# Patient Record
Sex: Male | Born: 1960 | Race: Black or African American | Hispanic: No | Marital: Single | Smoking: Current some day smoker
Health system: Southern US, Community
[De-identification: ages and names within clinical notes are randomized; demographics above are authoritative.]

## PROBLEM LIST (undated history)

## (undated) DIAGNOSIS — I10 Essential (primary) hypertension: Secondary | ICD-10-CM

## (undated) HISTORY — PX: EYE SURGERY: SHX253

---

## 2004-07-13 ENCOUNTER — Encounter: Admission: RE | Admit: 2004-07-13 | Discharge: 2004-07-13 | Payer: Self-pay | Admitting: Internal Medicine

## 2007-09-03 ENCOUNTER — Emergency Department (HOSPITAL_COMMUNITY): Admission: EM | Admit: 2007-09-03 | Discharge: 2007-09-04 | Payer: Self-pay | Admitting: Emergency Medicine

## 2010-07-01 ENCOUNTER — Emergency Department (HOSPITAL_COMMUNITY): Admission: EM | Admit: 2010-07-01 | Discharge: 2010-07-01 | Payer: Self-pay | Admitting: Family Medicine

## 2010-07-04 ENCOUNTER — Emergency Department (HOSPITAL_COMMUNITY): Admission: EM | Admit: 2010-07-04 | Discharge: 2010-07-04 | Payer: Self-pay | Admitting: Family Medicine

## 2010-07-07 ENCOUNTER — Emergency Department (HOSPITAL_COMMUNITY): Admission: EM | Admit: 2010-07-07 | Discharge: 2010-07-07 | Payer: Self-pay | Admitting: Family Medicine

## 2010-07-09 ENCOUNTER — Inpatient Hospital Stay (HOSPITAL_COMMUNITY): Admission: EM | Admit: 2010-07-09 | Discharge: 2010-07-11 | Payer: Self-pay | Admitting: Emergency Medicine

## 2010-07-09 ENCOUNTER — Ambulatory Visit: Payer: Self-pay | Admitting: Cardiology

## 2010-07-10 ENCOUNTER — Encounter (INDEPENDENT_AMBULATORY_CARE_PROVIDER_SITE_OTHER): Payer: Self-pay | Admitting: Internal Medicine

## 2010-08-10 ENCOUNTER — Emergency Department (HOSPITAL_COMMUNITY)
Admission: EM | Admit: 2010-08-10 | Discharge: 2010-08-10 | Disposition: A | Discharge status: Other Acute Inpatient Hospital | Payer: Self-pay | Source: Home / Self Care | Admitting: Family Medicine

## 2010-08-10 ENCOUNTER — Emergency Department (HOSPITAL_COMMUNITY)
Admission: EM | Admit: 2010-08-10 | Discharge: 2010-08-10 | Payer: Self-pay | Source: Home / Self Care | Admitting: Emergency Medicine

## 2010-08-12 ENCOUNTER — Inpatient Hospital Stay (HOSPITAL_COMMUNITY)
Admission: EM | Admit: 2010-08-12 | Discharge: 2010-08-15 | Payer: Self-pay | Source: Home / Self Care | Attending: Internal Medicine | Admitting: Internal Medicine

## 2010-08-12 LAB — URINALYSIS, ROUTINE W REFLEX MICROSCOPIC
Bilirubin Urine: NEGATIVE
Hemoglobin, Urine: NEGATIVE
Ketones, ur: NEGATIVE mg/dL
Nitrite: NEGATIVE
Protein, ur: NEGATIVE mg/dL
Specific Gravity, Urine: >1.046 — ABNORMAL HIGH (ref 1.005–1.030)
Urine Glucose, Fasting: NEGATIVE mg/dL
Urobilinogen, UA: 0.2 mg/dL (ref 0.0–1.0)
pH: 6.5 (ref 5.0–8.0)

## 2010-08-12 LAB — DIFFERENTIAL
Basophils Absolute: 0 10*3/uL (ref 0.0–0.1)
Basophils Relative: 0 % (ref 0–1)
Eosinophils Absolute: 0.3 10*3/uL (ref 0.0–0.7)
Eosinophils Relative: 4 % (ref 0–5)
Lymphocytes Relative: 40 % (ref 12–46)
Lymphs Abs: 2.9 10*3/uL (ref 0.7–4.0)
Monocytes Absolute: 0.7 10*3/uL (ref 0.1–1.0)
Monocytes Relative: 9 % (ref 3–12)
Neutro Abs: 3.4 10*3/uL (ref 1.7–7.7)
Neutrophils Relative %: 46 % (ref 43–77)

## 2010-08-12 LAB — VANCOMYCIN, TROUGH: Vancomycin Tr: 5.3 ug/mL — ABNORMAL LOW (ref 10.0–20.0)

## 2010-08-12 LAB — BASIC METABOLIC PANEL
BUN: 9 mg/dL (ref 6–23)
CO2: 29 mEq/L (ref 19–32)
Calcium: 8.9 mg/dL (ref 8.4–10.5)
Chloride: 103 mEq/L (ref 96–112)
Creatinine, Ser: 0.95 mg/dL (ref 0.4–1.5)
GFR calc Af Amer: >60 mL/min (ref >60)
GFR calc non Af Amer: >60 mL/min (ref >60)
Glucose, Bld: 109 mg/dL — ABNORMAL HIGH (ref 70–99)
Potassium: 3.6 mEq/L (ref 3.5–5.1)
Sodium: 139 mEq/L (ref 135–145)

## 2010-08-12 LAB — CBC
HCT: 37.7 % — ABNORMAL LOW (ref 39.0–52.0)
Hemoglobin: 12.7 g/dL — ABNORMAL LOW (ref 13.0–17.0)
MCH: 28.7 pg (ref 26.0–34.0)
MCHC: 33.7 g/dL (ref 30.0–36.0)
MCV: 85.1 fL (ref 78.0–100.0)
Platelets: 215 10*3/uL (ref 150–400)
RBC: 4.43 MIL/uL (ref 4.22–5.81)
RDW: 13 % (ref 11.5–15.5)
WBC: 7.3 10*3/uL (ref 4.0–10.5)

## 2010-08-12 LAB — MRSA PCR SCREENING: MRSA by PCR: NEGATIVE

## 2010-08-13 LAB — BASIC METABOLIC PANEL
BUN: 6 mg/dL (ref 6–23)
CO2: 28 mEq/L (ref 19–32)
Calcium: 8.8 mg/dL (ref 8.4–10.5)
Chloride: 106 mEq/L (ref 96–112)
Creatinine, Ser: 1.04 mg/dL (ref 0.4–1.5)
GFR calc Af Amer: >60 mL/min (ref >60)
GFR calc non Af Amer: >60 mL/min (ref >60)
Glucose, Bld: 91 mg/dL (ref 70–99)
Potassium: 3.9 mEq/L (ref 3.5–5.1)
Sodium: 138 mEq/L (ref 135–145)

## 2010-08-13 LAB — CBC
HCT: 39.2 % (ref 39.0–52.0)
Hemoglobin: 13 g/dL (ref 13.0–17.0)
MCH: 28.1 pg (ref 26.0–34.0)
MCHC: 33.2 g/dL (ref 30.0–36.0)
MCV: 84.8 fL (ref 78.0–100.0)
Platelets: 218 10*3/uL (ref 150–400)
RBC: 4.62 MIL/uL (ref 4.22–5.81)
RDW: 12.8 % (ref 11.5–15.5)
WBC: 7.2 10*3/uL (ref 4.0–10.5)

## 2010-08-13 LAB — SEDIMENTATION RATE: Sed Rate: 26 mm/hr — ABNORMAL HIGH (ref 0–16)

## 2010-08-14 LAB — EYE CULTURE: Culture: NO GROWTH

## 2010-09-11 ENCOUNTER — Observation Stay (HOSPITAL_COMMUNITY)
Admission: EM | Admit: 2010-09-11 | Discharge: 2010-09-12 | Disposition: A | Discharge status: Home / Self Care | Payer: Self-pay | Attending: Emergency Medicine | Admitting: Emergency Medicine

## 2010-09-11 ENCOUNTER — Emergency Department (HOSPITAL_COMMUNITY): Payer: Self-pay

## 2010-09-11 ENCOUNTER — Inpatient Hospital Stay (INDEPENDENT_AMBULATORY_CARE_PROVIDER_SITE_OTHER)
Admission: RE | Admit: 2010-09-11 | Discharge: 2010-09-11 | Disposition: A | Discharge status: Home / Self Care | Payer: Self-pay | Source: Ambulatory Visit | Attending: Family Medicine | Admitting: Family Medicine

## 2010-09-11 DIAGNOSIS — L02419 Cutaneous abscess of limb, unspecified: Principal | ICD-10-CM | POA: Insufficient documentation

## 2010-09-11 DIAGNOSIS — M7989 Other specified soft tissue disorders: Secondary | ICD-10-CM | POA: Insufficient documentation

## 2010-09-11 DIAGNOSIS — L03119 Cellulitis of unspecified part of limb: Secondary | ICD-10-CM | POA: Insufficient documentation

## 2010-09-11 DIAGNOSIS — I10 Essential (primary) hypertension: Secondary | ICD-10-CM

## 2010-09-11 LAB — DIFFERENTIAL
Basophils Absolute: 0 10*3/uL (ref 0.0–0.1)
Basophils Relative: 0 % (ref 0–1)
Eosinophils Absolute: 0.3 10*3/uL (ref 0.0–0.7)
Eosinophils Relative: 2 % (ref 0–5)
Lymphocytes Relative: 15 % (ref 12–46)
Lymphs Abs: 1.6 10*3/uL (ref 0.7–4.0)
Monocytes Absolute: 0.8 10*3/uL (ref 0.1–1.0)
Monocytes Relative: 7 % (ref 3–12)
Neutro Abs: 8.3 10*3/uL — ABNORMAL HIGH (ref 1.7–7.7)
Neutrophils Relative %: 76 % (ref 43–77)

## 2010-09-11 LAB — CBC
HCT: 40.1 % (ref 39.0–52.0)
Hemoglobin: 13.5 g/dL (ref 13.0–17.0)
MCH: 28.5 pg (ref 26.0–34.0)
MCHC: 33.7 g/dL (ref 30.0–36.0)
MCV: 84.6 fL (ref 78.0–100.0)
Platelets: 250 10*3/uL (ref 150–400)
RBC: 4.74 MIL/uL (ref 4.22–5.81)
RDW: 13.6 % (ref 11.5–15.5)
WBC: 10.9 10*3/uL — ABNORMAL HIGH (ref 4.0–10.5)

## 2010-09-11 LAB — POCT I-STAT, CHEM 8
BUN: 16 mg/dL (ref 6–23)
Calcium, Ion: 1.18 mmol/L (ref 1.12–1.32)
Chloride: 105 mEq/L (ref 96–112)
Creatinine, Ser: 1.2 mg/dL (ref 0.4–1.5)
Glucose, Bld: 97 mg/dL (ref 70–99)
HCT: 42 % (ref 39.0–52.0)
Hemoglobin: 14.3 g/dL (ref 13.0–17.0)
Potassium: 3.9 mEq/L (ref 3.5–5.1)
Sodium: 141 mEq/L (ref 135–145)
TCO2: 27 mmol/L (ref 0–100)

## 2010-09-12 DIAGNOSIS — M7989 Other specified soft tissue disorders: Secondary | ICD-10-CM

## 2010-09-12 LAB — HEPATIC FUNCTION PANEL
ALT: 29 U/L (ref 0–53)
AST: 25 U/L (ref 0–37)
Albumin: 4.3 g/dL (ref 3.5–5.2)
Alkaline Phosphatase: 47 U/L (ref 39–117)
Bilirubin, Direct: 0.1 mg/dL (ref 0.0–0.3)
Indirect Bilirubin: 0.5 mg/dL (ref 0.3–0.9)
Total Bilirubin: 0.6 mg/dL (ref 0.3–1.2)
Total Protein: 7.2 g/dL (ref 6.0–8.3)

## 2010-09-12 LAB — PROTIME-INR
INR: 1.11 (ref 0.00–1.49)
Prothrombin Time: 14.5 seconds (ref 11.6–15.2)

## 2010-09-12 LAB — URINALYSIS, ROUTINE W REFLEX MICROSCOPIC
Bilirubin Urine: NEGATIVE
Ketones, ur: NEGATIVE mg/dL
Leukocytes, UA: NEGATIVE
Nitrite: NEGATIVE
Protein, ur: NEGATIVE mg/dL
Specific Gravity, Urine: 1.011 (ref 1.005–1.030)
Urine Glucose, Fasting: NEGATIVE mg/dL
Urobilinogen, UA: 0.2 mg/dL (ref 0.0–1.0)
pH: 5.5 (ref 5.0–8.0)

## 2010-09-12 LAB — URINE MICROSCOPIC-ADD ON

## 2010-09-12 LAB — APTT: aPTT: 30 seconds (ref 24–37)

## 2010-09-19 ENCOUNTER — Emergency Department (HOSPITAL_COMMUNITY): Payer: Self-pay

## 2010-09-19 ENCOUNTER — Observation Stay (HOSPITAL_COMMUNITY)
Admission: EM | Admit: 2010-09-19 | Discharge: 2010-09-20 | Disposition: A | Discharge status: Home / Self Care | Payer: Self-pay | Attending: Emergency Medicine | Admitting: Emergency Medicine

## 2010-09-19 DIAGNOSIS — L03119 Cellulitis of unspecified part of limb: Secondary | ICD-10-CM | POA: Insufficient documentation

## 2010-09-19 DIAGNOSIS — L02419 Cutaneous abscess of limb, unspecified: Principal | ICD-10-CM | POA: Insufficient documentation

## 2010-09-19 DIAGNOSIS — I10 Essential (primary) hypertension: Secondary | ICD-10-CM | POA: Insufficient documentation

## 2010-09-19 DIAGNOSIS — M7989 Other specified soft tissue disorders: Secondary | ICD-10-CM | POA: Insufficient documentation

## 2010-09-19 LAB — COMPREHENSIVE METABOLIC PANEL
AST: 31 U/L (ref 0–37)
Albumin: 4.2 g/dL (ref 3.5–5.2)
Alkaline Phosphatase: 46 U/L (ref 39–117)
BUN: 15 mg/dL (ref 6–23)
CO2: 23 mEq/L (ref 19–32)
Chloride: 101 mEq/L (ref 96–112)
GFR calc Af Amer: >60 mL/min (ref >60)
GFR calc non Af Amer: >60 mL/min (ref >60)
Potassium: 3.6 mEq/L (ref 3.5–5.1)
Total Bilirubin: 0.2 mg/dL — ABNORMAL LOW (ref 0.3–1.2)

## 2010-09-19 LAB — DIFFERENTIAL
Basophils Absolute: 0 10*3/uL (ref 0.0–0.1)
Basophils Relative: 0 % (ref 0–1)
Eosinophils Absolute: 0.3 10*3/uL (ref 0.0–0.7)
Eosinophils Relative: 4 % (ref 0–5)
Lymphocytes Relative: 44 % (ref 12–46)
Lymphs Abs: 3.7 10*3/uL (ref 0.7–4.0)
Monocytes Absolute: 0.5 10*3/uL (ref 0.1–1.0)
Monocytes Relative: 6 % (ref 3–12)
Neutro Abs: 4 10*3/uL (ref 1.7–7.7)
Neutrophils Relative %: 47 % (ref 43–77)

## 2010-09-19 LAB — CBC
HCT: 39.5 % (ref 39.0–52.0)
Hemoglobin: 13.6 g/dL (ref 13.0–17.0)
MCH: 28.8 pg (ref 26.0–34.0)
MCHC: 34.4 g/dL (ref 30.0–36.0)
MCV: 83.5 fL (ref 78.0–100.0)
Platelets: 288 10*3/uL (ref 150–400)
RBC: 4.73 MIL/uL (ref 4.22–5.81)
RDW: 13.2 % (ref 11.5–15.5)
WBC: 8.6 10*3/uL (ref 4.0–10.5)

## 2010-09-26 LAB — CULTURE, BLOOD (ROUTINE X 2)
Culture  Setup Time: 201202120114
Culture: NO GROWTH

## 2010-10-04 ENCOUNTER — Emergency Department (HOSPITAL_COMMUNITY)
Admission: EM | Admit: 2010-10-04 | Discharge: 2010-10-04 | Disposition: A | Discharge status: Home / Self Care | Payer: Self-pay | Attending: Emergency Medicine | Admitting: Emergency Medicine

## 2010-10-04 ENCOUNTER — Emergency Department (HOSPITAL_COMMUNITY): Payer: Self-pay

## 2010-10-04 DIAGNOSIS — M25469 Effusion, unspecified knee: Secondary | ICD-10-CM | POA: Insufficient documentation

## 2010-10-04 DIAGNOSIS — I10 Essential (primary) hypertension: Secondary | ICD-10-CM | POA: Insufficient documentation

## 2010-10-04 DIAGNOSIS — M7989 Other specified soft tissue disorders: Secondary | ICD-10-CM | POA: Insufficient documentation

## 2010-10-20 LAB — URINALYSIS, ROUTINE W REFLEX MICROSCOPIC
Bilirubin Urine: NEGATIVE
Hgb urine dipstick: NEGATIVE
Ketones, ur: NEGATIVE mg/dL
Nitrite: NEGATIVE
Protein, ur: NEGATIVE mg/dL
Urobilinogen, UA: 0.2 mg/dL (ref 0.0–1.0)

## 2010-10-20 LAB — CBC
HCT: 40 % (ref 39.0–52.0)
Hemoglobin: 13.8 g/dL (ref 13.0–17.0)
Hemoglobin: 15.3 g/dL (ref 13.0–17.0)
MCH: 29.8 pg (ref 26.0–34.0)
MCHC: 35.3 g/dL (ref 30.0–36.0)
MCV: 85.9 fL (ref 78.0–100.0)
Platelets: 211 10*3/uL (ref 150–400)
RBC: 5.09 MIL/uL (ref 4.22–5.81)
RDW: 12.6 % (ref 11.5–15.5)
RDW: 12.8 % (ref 11.5–15.5)
WBC: 11.8 10*3/uL — ABNORMAL HIGH (ref 4.0–10.5)
WBC: 6.1 10*3/uL (ref 4.0–10.5)

## 2010-10-20 LAB — BASIC METABOLIC PANEL
BUN: 12 mg/dL (ref 6–23)
Calcium: 8.9 mg/dL (ref 8.4–10.5)
Calcium: 9.4 mg/dL (ref 8.4–10.5)
Creatinine, Ser: 1.03 mg/dL (ref 0.4–1.5)
GFR calc Af Amer: >60 mL/min (ref >60)
GFR calc Af Amer: >60 mL/min (ref >60)
GFR calc non Af Amer: >60 mL/min (ref >60)
GFR calc non Af Amer: >60 mL/min (ref >60)
Glucose, Bld: 97 mg/dL (ref 70–99)
Potassium: 4.5 mEq/L (ref 3.5–5.1)
Sodium: 140 mEq/L (ref 135–145)
Sodium: 140 mEq/L (ref 135–145)

## 2010-10-20 LAB — RAPID URINE DRUG SCREEN, HOSP PERFORMED
Barbiturates: NOT DETECTED
Tetrahydrocannabinol: NOT DETECTED

## 2010-10-20 LAB — DIFFERENTIAL
Basophils Absolute: 0 10*3/uL (ref 0.0–0.1)
Basophils Absolute: 0 10*3/uL (ref 0.0–0.1)
Basophils Relative: 0 % (ref 0–1)
Lymphocytes Relative: 29 % (ref 12–46)
Lymphocytes Relative: 48 % — ABNORMAL HIGH (ref 12–46)
Monocytes Absolute: 0.4 10*3/uL (ref 0.1–1.0)
Monocytes Relative: 6 % (ref 3–12)
Neutro Abs: 2.6 10*3/uL (ref 1.7–7.7)
Neutro Abs: 7.5 10*3/uL (ref 1.7–7.7)
Neutrophils Relative %: 42 % — ABNORMAL LOW (ref 43–77)
Neutrophils Relative %: 64 % (ref 43–77)

## 2010-10-20 LAB — ETHANOL: Alcohol, Ethyl (B): <5 mg/dL (ref 0–10)

## 2010-10-20 LAB — CARDIAC PANEL(CRET KIN+CKTOT+MB+TROPI)
Total CK: 290 U/L — ABNORMAL HIGH (ref 7–232)
Troponin I: 0.02 ng/mL (ref 0.00–0.06)

## 2010-10-20 LAB — LIPID PANEL
Total CHOL/HDL Ratio: 7.6 RATIO
VLDL: 50 mg/dL — ABNORMAL HIGH (ref 0–40)

## 2010-10-20 LAB — CK TOTAL AND CKMB (NOT AT ARMC)
CK, MB: 5 ng/mL — ABNORMAL HIGH (ref 0.3–4.0)
Relative Index: 1.1 (ref 0.0–2.5)
Total CK: 473 U/L — ABNORMAL HIGH (ref 7–232)

## 2010-10-20 LAB — HEPATIC FUNCTION PANEL
AST: 31 U/L (ref 0–37)
Albumin: 4 g/dL (ref 3.5–5.2)
Total Bilirubin: 0.4 mg/dL (ref 0.3–1.2)

## 2010-10-20 LAB — TROPONIN I: Troponin I: 0.02 ng/mL (ref 0.00–0.06)

## 2011-02-23 ENCOUNTER — Emergency Department (HOSPITAL_COMMUNITY)
Admission: EM | Admit: 2011-02-23 | Discharge: 2011-02-23 | Disposition: A | Discharge status: Home / Self Care | Payer: Self-pay | Attending: Emergency Medicine | Admitting: Emergency Medicine

## 2011-02-23 DIAGNOSIS — F17200 Nicotine dependence, unspecified, uncomplicated: Secondary | ICD-10-CM | POA: Insufficient documentation

## 2011-02-23 DIAGNOSIS — R52 Pain, unspecified: Secondary | ICD-10-CM | POA: Insufficient documentation

## 2011-02-23 DIAGNOSIS — I10 Essential (primary) hypertension: Secondary | ICD-10-CM | POA: Insufficient documentation

## 2011-08-28 ENCOUNTER — Emergency Department (INDEPENDENT_AMBULATORY_CARE_PROVIDER_SITE_OTHER)
Admission: EM | Admit: 2011-08-28 | Discharge: 2011-08-28 | Disposition: A | Discharge status: Home / Self Care | Payer: Self-pay | Source: Home / Self Care

## 2011-08-28 ENCOUNTER — Encounter (HOSPITAL_COMMUNITY): Payer: Self-pay | Admitting: *Deleted

## 2011-08-28 DIAGNOSIS — R197 Diarrhea, unspecified: Secondary | ICD-10-CM

## 2011-08-28 DIAGNOSIS — R3129 Other microscopic hematuria: Secondary | ICD-10-CM

## 2011-08-28 HISTORY — DX: Essential (primary) hypertension: I10

## 2011-08-28 LAB — POCT URINALYSIS DIP (DEVICE)
Glucose, UA: NEGATIVE mg/dL
Nitrite: NEGATIVE
Protein, ur: 30 mg/dL — AB
Urobilinogen, UA: 0.2 mg/dL (ref 0.0–1.0)

## 2011-08-28 LAB — STOOL CULTURE: Special Requests: NORMAL

## 2011-08-28 MED ORDER — ACETAMINOPHEN 325 MG PO TABS
ORAL_TABLET | ORAL | Status: AC
  Filled 2011-08-28: qty 3

## 2011-08-28 MED ORDER — CIPROFLOXACIN HCL 500 MG PO TABS: 500.0000 mg | ORAL_TABLET | Freq: Two times a day (BID) | ORAL | Status: AC

## 2011-08-28 MED ORDER — ACETAMINOPHEN 325 MG PO TABS
975.0000 mg | ORAL_TABLET | Freq: Once | ORAL | Status: AC
  Administered 2011-08-28: 975 mg via ORAL

## 2011-08-28 NOTE — ED Provider Notes (Signed)
Medical screening examination/treatment/procedure(s) were performed by non-physician practitioner and as supervising physician I was immediately available for consultation/collaboration.  LANEY,RONNIE   Ronnie Laney, MD 08/28/11 2137 

## 2011-08-28 NOTE — ED Notes (Addendum)
C/O intermittent periumbilical cramping since Thursday w/ nausea and watery diarrhea.  Denies vomiting.  Reports diarrhea approx "every hour".  Denies any blood in stool.  C/O chills since Thursday.  Has tried [employer] with some relief.  Denies any recent foreign travel; no other family members ill.  Is able to keep down PO fluids, but has not taken HTN med x 3 days due to nausea.

## 2011-08-28 NOTE — ED Provider Notes (Signed)
History     CSN: [id number]  Arrival date & time 08/28/11  0957   None     Chief Complaint  Patient presents with  . Abdominal Pain  . Diarrhea  . Fever    (Consider location/radiation/quality/duration/timing/severity/associated sxs/prior treatment) HPI Comments: Patient presents with complaints of diarrhea. He states that he is having watery diarrhea approximately every hour for the last 3 days. He denies blood in his stool. He has had no nausea or vomiting but does have decreased appetite. He is drinking fluids and states he is voiding regularly. He has crampy stomach pains but no worsening with diarrhea. He has not checked his fever at home. He denies any recent travel, antibiotics, or known sick contacts. He denies eating any undercooked foods, and states the only thing different that he had eaten prior to symptoms onset was a vegen burger. Pt admits to hx of noncompliance with his HTN meds and not due to current illness. He denies HA or dizziness.    Past Medical History  Diagnosis Date  . Hypertension     History reviewed. No pertinent past surgical history.  History reviewed. No pertinent family history.  History  Substance Use Topics  . Smoking status: Current Some Day Smoker  . Smokeless tobacco: Not on file  . Alcohol Use: No      Review of Systems  Constitutional: Positive for fever. Negative for chills.  Respiratory: Negative for cough and shortness of breath.   Cardiovascular: Negative for chest pain.  Gastrointestinal: Positive for abdominal pain and diarrhea. Negative for nausea and vomiting.  Genitourinary: Negative for dysuria, frequency and decreased urine volume.    Allergies  Review of patient's allergies indicates no known allergies.  Home Medications   Current Outpatient Rx  Name Route Sig Dispense Refill  . UNKNOWN TO PATIENT  HTN med - unk name    . CIPROFLOXACIN HCL 500 MG PO TABS Oral Take 1 tablet (500 mg total) by mouth every 12  (twelve) hours. 10 tablet 0    BP 144/91  Pulse 111  Temp(Src) 101.5 F (38.6 C) (Oral)  Resp 18  SpO2 96%  Physical Exam  Nursing note and vitals reviewed. Constitutional: He appears well-developed and well-nourished. No distress.  HENT:  Mouth/Throat: Uvula is midline and mucous membranes are normal. No oropharyngeal exudate, posterior oropharyngeal edema or posterior oropharyngeal erythema.  Cardiovascular: Regular rhythm and normal heart sounds.   Pulmonary/Chest: Effort normal and breath sounds normal. No respiratory distress.  Abdominal: Soft. Bowel sounds are normal. He exhibits no mass. There is no hepatosplenomegaly. There is generalized tenderness. There is no rebound and no guarding.  Skin: Skin is warm and dry.  Psychiatric: He has a normal mood and affect.    ED Course  Procedures (including critical care time)  Labs Reviewed  POCT URINALYSIS DIP (DEVICE) - Abnormal; Notable for the following:    Hgb urine dipstick MODERATE (*)    Protein, ur 30 (*)    All other components within normal limits  STOOL CULTURE  POCT URINALYSIS DIPSTICK   No results found.   1. Acute diarrhea   2. Hematuria, microscopic       MDM  Watery diarrhea and fever x 3 days.         Melody Comas, PA 08/28/11 [address] [place], [place] 08/28/11 1142

## 2012-06-21 IMAGING — CR DG CHEST 2V
2 series · 2 of 2 positions shown · non-contrast
Comparison: 07/09/2010

CLINICAL DATA: Swelling in legs for 4-5 days

CHEST - 2 VIEW

[w chest pa]
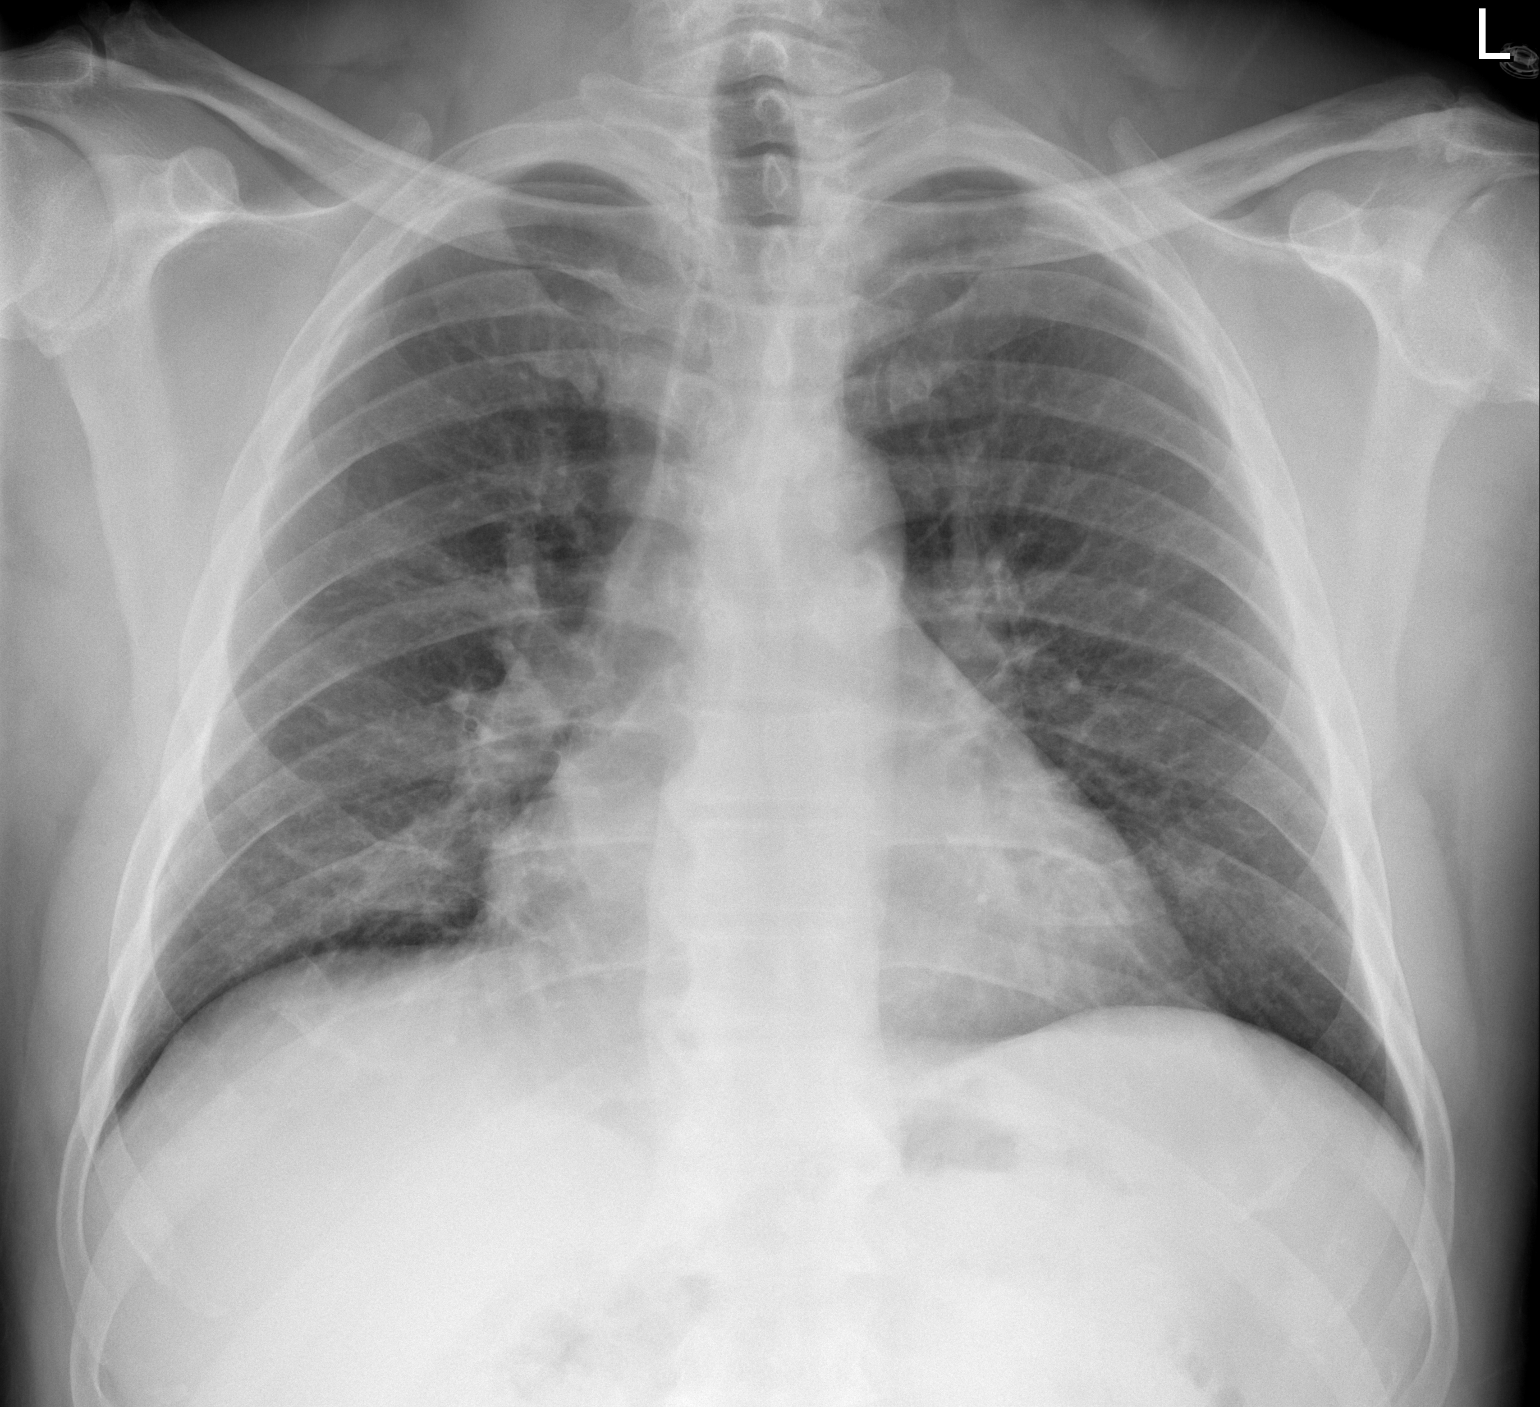

[w chest lat]
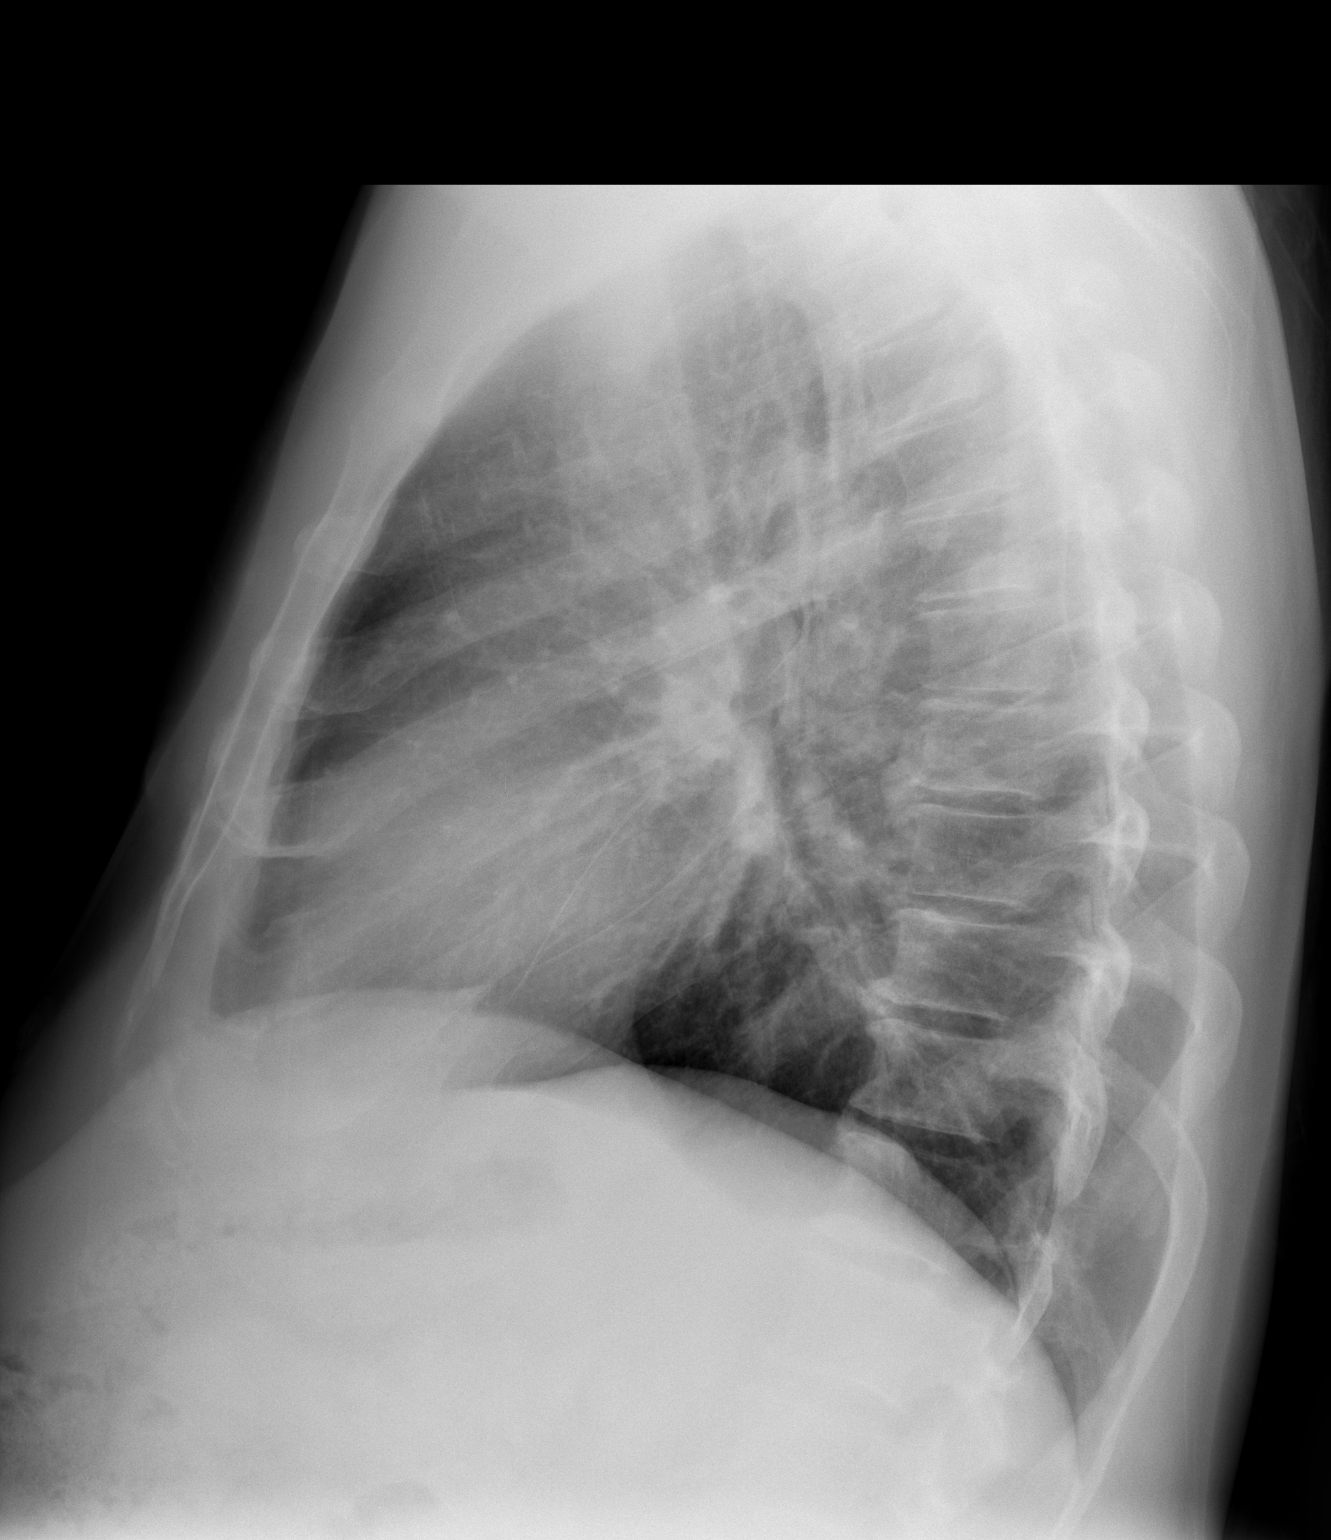

[2 of 2 positions shown; findings below may reference images not displayed]

FINDINGS: Cardiomegaly.  No infiltrates or failure.  Minimal right
basilar atelectasis.  Bones unremarkable.  Improved aeration.
IMPRESSION: Cardiomegaly.  No evidence for acute failure.  Minimal right
basilar atelectasis.

## 2012-06-28 IMAGING — CR DG TIBIA/FIBULA 2V*R*
4 series · 4 of 4 positions shown · non-contrast
Comparison: None

CLINICAL DATA: Cellulitis in the right leg.  Lower leg edema.  No
prior injury for trauma.

RIGHT TIBIA AND FIBULA - 2 VIEW

[view not recorded (1 of 4)]
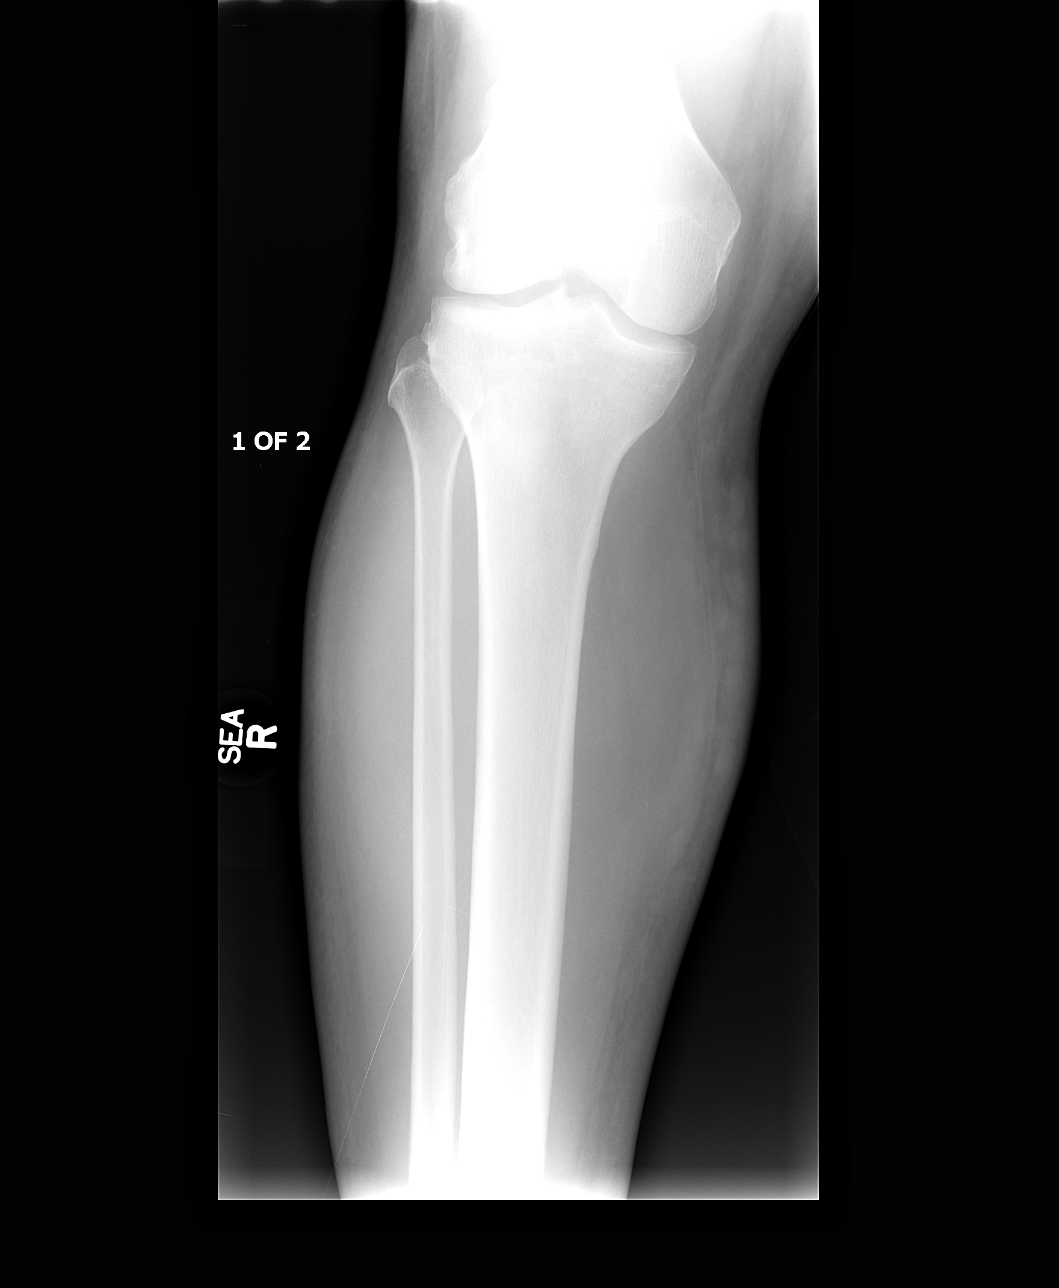

[view not recorded (2 of 4)]
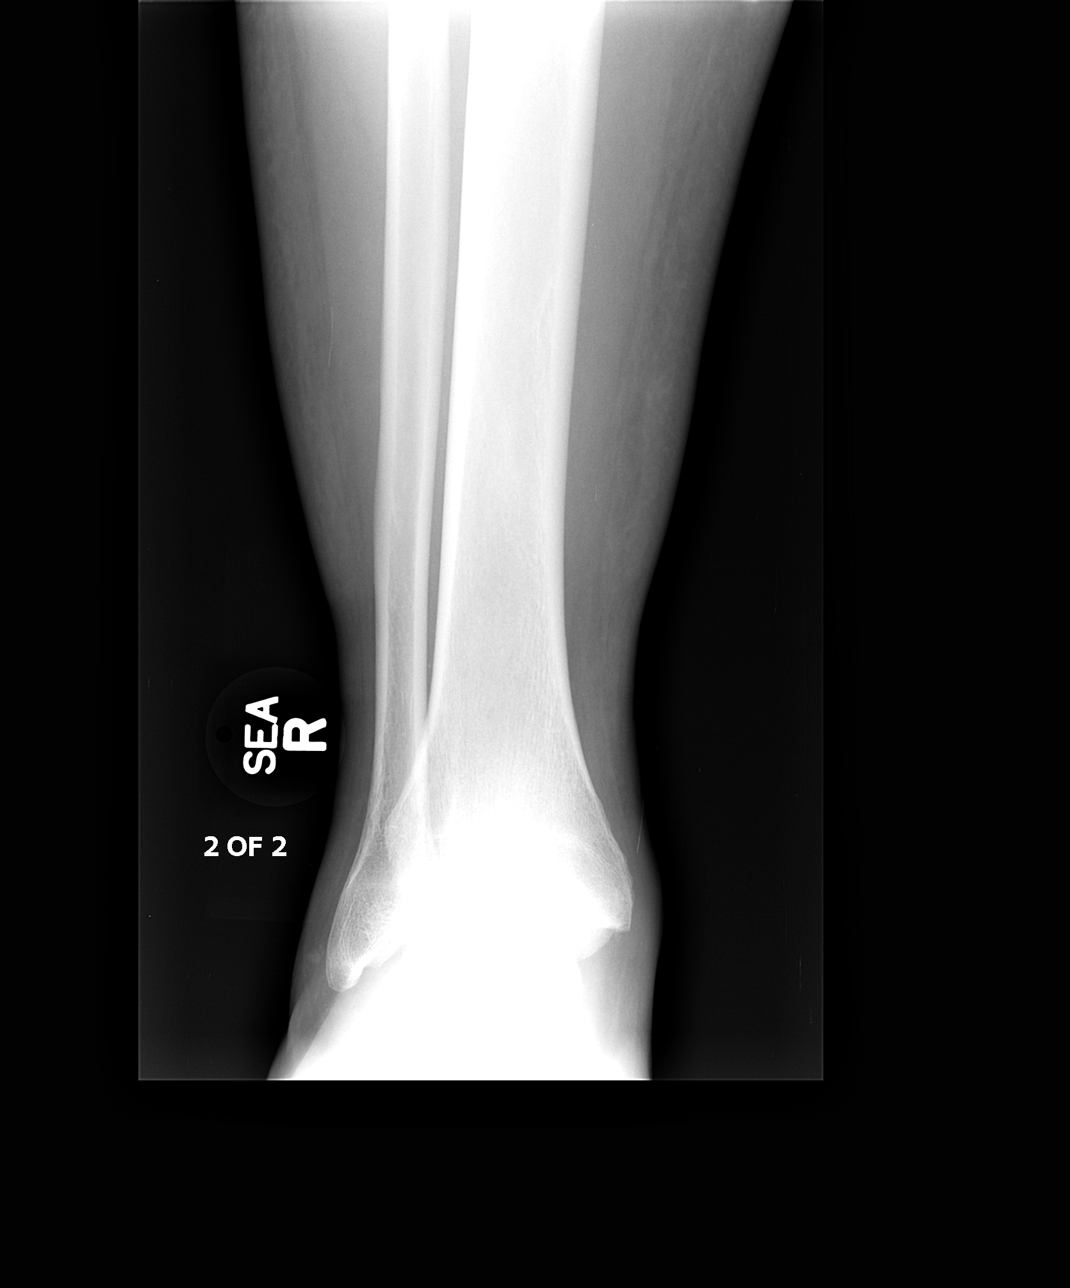

[view not recorded (3 of 4)]
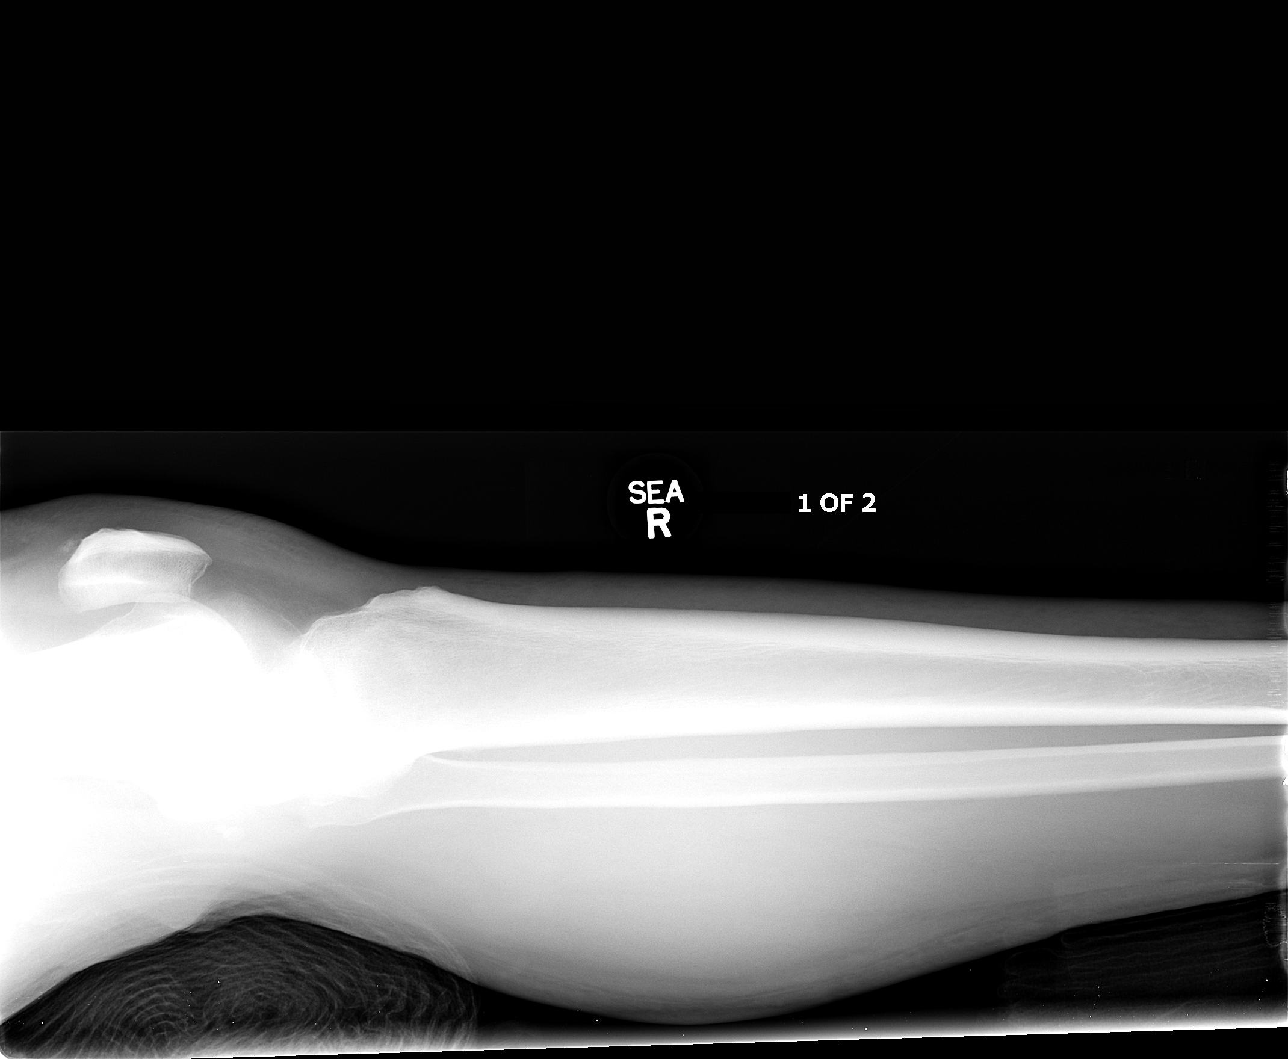

[view not recorded (4 of 4)]
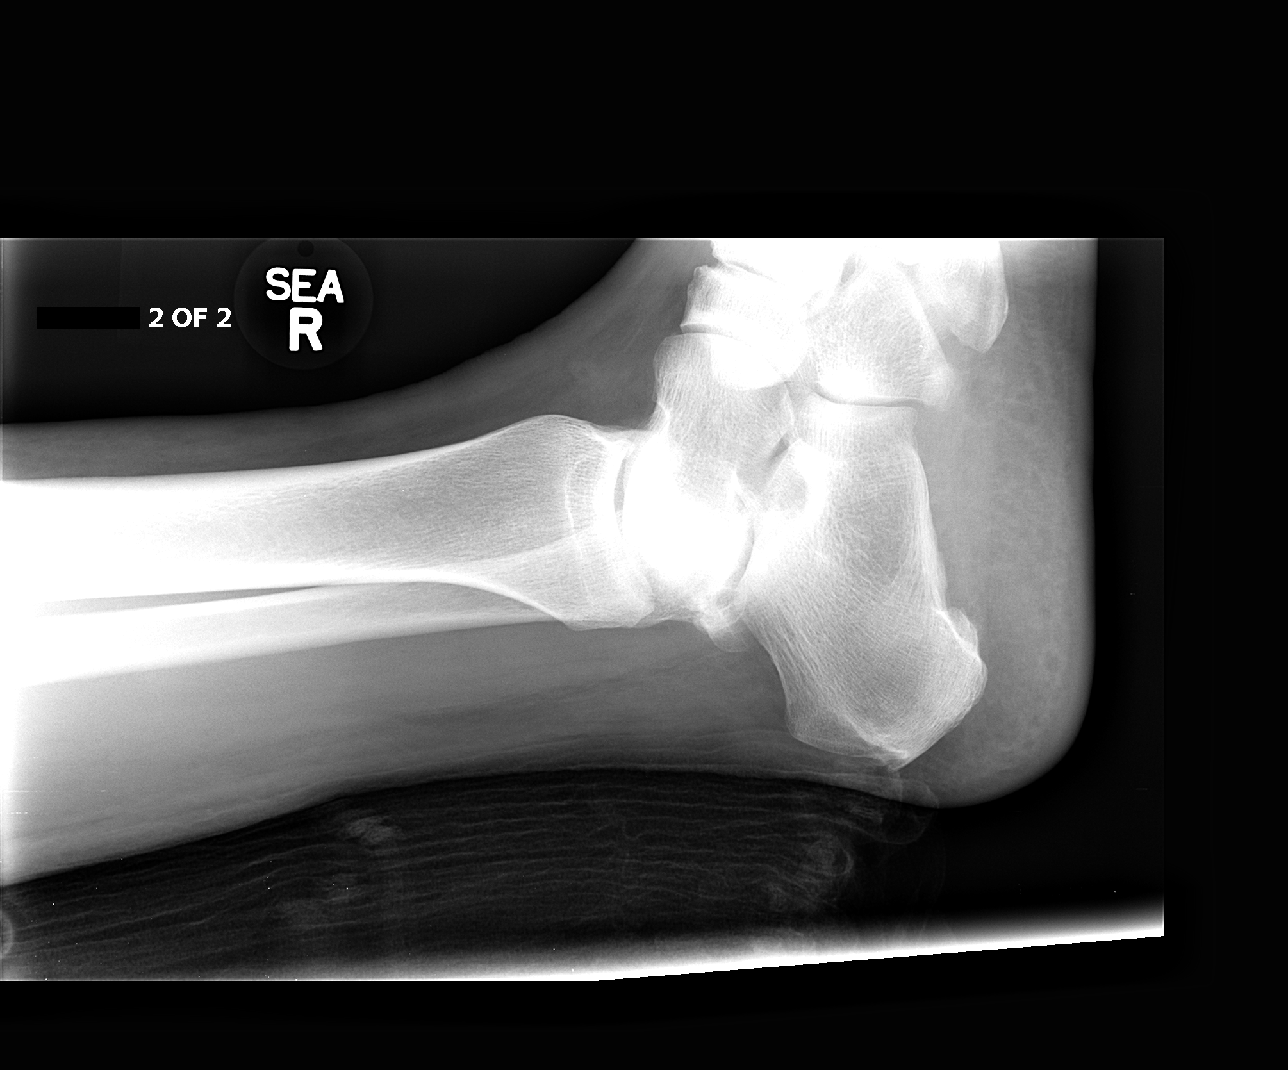

[4 of 4 positions shown; findings below may reference images not displayed]

FINDINGS: There is diffuse soft tissue swelling of the lower leg.
No evidence for acute fracture or dislocation.  No radiopaque
foreign body or soft tissue gas.
IMPRESSION: Diffuse soft tissue swelling.  No acute bony finding.

## 2016-07-23 ENCOUNTER — Ambulatory Visit (HOSPITAL_COMMUNITY)
Admission: EM | Admit: 2016-07-23 | Discharge: 2016-07-23 | Disposition: A | Discharge status: Home / Self Care | Payer: Self-pay | Attending: Internal Medicine | Admitting: Internal Medicine

## 2016-07-23 ENCOUNTER — Encounter (HOSPITAL_COMMUNITY): Payer: Self-pay | Admitting: Emergency Medicine

## 2016-07-23 DIAGNOSIS — J208 Acute bronchitis due to other specified organisms: Secondary | ICD-10-CM

## 2016-07-23 DIAGNOSIS — M7661 Achilles tendinitis, right leg: Secondary | ICD-10-CM

## 2016-07-23 MED ORDER — AZITHROMYCIN 250 MG PO TABS: 250.0000 mg | ORAL_TABLET | Freq: Every day | ORAL | 0 refills | Status: DC

## 2016-07-23 MED ORDER — BENZONATATE 100 MG PO CAPS: 200.0000 mg | ORAL_CAPSULE | Freq: Three times a day (TID) | ORAL | 0 refills | Status: DC | PRN

## 2016-07-23 MED ORDER — METHYLPREDNISOLONE 4 MG PO TBPK: ORAL_TABLET | ORAL | 0 refills | Status: DC

## 2016-07-23 NOTE — ED Triage Notes (Signed)
[facility].   The patient complained of a cough and congestion x 1 month.  The patient also reported achilles tendon and left ankle pain x 1 year that is recurrent. No known injury.

## 2016-07-23 NOTE — ED Provider Notes (Signed)
CSN: [id number]     Arrival date & time 07/23/16  1039 History   None    Chief Complaint  Patient presents with  . Cough  . Ankle Pain   (Consider location/radiation/quality/duration/timing/severity/associated sxs/prior Treatment) Patient c/o URI sx's and cough and he c/o left achilles tendonitis and left heel pain   The history is provided by the patient.  Cough  Cough characteristics:  Productive Sputum characteristics:  Yellow Severity:  Moderate Onset quality:  Gradual Duration:  4 weeks Timing:  Constant Progression:  Worsening Chronicity:  New Smoker: yes   Context: smoke exposure, upper respiratory infection and weather changes   Relieved by:  Nothing Worsened by:  Nothing Ineffective treatments:  None tried Ankle Pain  Associated symptoms: fatigue     Past Medical History:  Diagnosis Date  . Hypertension    History reviewed. No pertinent surgical history. History reviewed. No pertinent family history. Social History  Substance Use Topics  . Smoking status: Current Some Day Smoker    Packs/day: 0.50    Years: 30.00    Types: Cigarettes  . Smokeless tobacco: Never Used  . Alcohol use No    Review of Systems  Constitutional: Positive for fatigue.  HENT: Negative.   Eyes: Negative.   Respiratory: Positive for cough.   Gastrointestinal: Negative.   Endocrine: Negative.   Genitourinary: Negative.   Musculoskeletal: Negative.   Allergic/Immunologic: Negative.   Neurological: Negative.   Hematological: Negative.   Psychiatric/Behavioral: Negative.     Allergies  Patient has no known allergies.  Home Medications   Prior to Admission medications   Medication Sig Start Date End Date Taking? Authorizing Provider  azithromycin (ZITHROMAX) 250 MG tablet Take 1 tablet (250 mg total) by mouth daily. Take first 2 tablets together, then 1 every day until finished. 07/23/16   Deatra CanterWilliam J Oxford, FNP  benzonatate (TESSALON) 100 MG capsule Take 2 capsules (200  mg total) by mouth 3 (three) times daily as needed for cough. 07/23/16   Deatra CanterWilliam J Oxford, FNP  methylPREDNISolone (MEDROL DOSEPAK) 4 MG TBPK tablet Take 6-5-4-3-2-1 po qd 07/23/16   Deatra CanterWilliam J Oxford, FNP  UNKNOWN TO PATIENT HTN med - unk name    Historical Provider, MD   Meds Ordered and Administered this Visit  Medications - No data to display  BP (!) 180/107 (BP Location: Left Arm)   Pulse 87   Temp 98.5 F (36.9 C) (Oral)   Resp 18   SpO2 99%  No data found.   Physical Exam  Constitutional: He appears well-developed and well-nourished.  HENT:  Head: Normocephalic and atraumatic.  Right Ear: External ear normal.  Left Ear: External ear normal.  Mouth/Throat: Oropharynx is clear and moist.  Eyes: Conjunctivae and EOM are normal. Pupils are equal, round, and reactive to light.  Neck: Normal range of motion. Neck supple.  Cardiovascular: Normal rate, regular rhythm and normal heart sounds.   Pulmonary/Chest: Effort normal. He has wheezes.  Abdominal: Soft. Bowel sounds are normal.  Musculoskeletal: He exhibits tenderness.  Left achilles tendon discomfort  Nursing note and vitals reviewed.   Urgent Care Course   Clinical Course     Procedures (including critical care time)  Labs Review Labs Reviewed - No data to display  Imaging Review No results found.   Visual Acuity Review  Right Eye Distance:   Left Eye Distance:   Bilateral Distance:    Right Eye Near:   Left Eye Near:    Bilateral Near:  MDM   1. Tendonitis, Achilles, right   2. Acute bronchitis due to other specified organisms    Medrol dose pack as directed Zpak as direted Tessalon Perles  Push po fluids, rest, tylenol and motrin otc prn as directed for fever, arthralgias, and myalgias.  Follow up prn if sx's continue or persist.    Deatra CanterWilliam J Oxford, FNP 07/23/16 [phone]

## 2016-11-10 ENCOUNTER — Encounter (HOSPITAL_COMMUNITY): Payer: Self-pay | Admitting: Family Medicine

## 2016-11-10 ENCOUNTER — Ambulatory Visit (HOSPITAL_COMMUNITY)
Admission: EM | Admit: 2016-11-10 | Discharge: 2016-11-10 | Disposition: A | Discharge status: Home / Self Care | Payer: Self-pay | Attending: Family Medicine | Admitting: Family Medicine

## 2016-11-10 DIAGNOSIS — R059 Cough, unspecified: Secondary | ICD-10-CM

## 2016-11-10 DIAGNOSIS — R05 Cough: Secondary | ICD-10-CM

## 2016-11-10 DIAGNOSIS — J40 Bronchitis, not specified as acute or chronic: Secondary | ICD-10-CM

## 2016-11-10 MED ORDER — BENZONATATE 100 MG PO CAPS: 200.0000 mg | ORAL_CAPSULE | Freq: Three times a day (TID) | ORAL | 0 refills | Status: DC | PRN

## 2016-11-10 MED ORDER — METHYLPREDNISOLONE 4 MG PO TBPK: ORAL_TABLET | ORAL | 0 refills | Status: DC

## 2016-11-10 MED ORDER — AZITHROMYCIN 250 MG PO TABS: 250.0000 mg | ORAL_TABLET | Freq: Every day | ORAL | 0 refills | Status: DC

## 2016-11-10 NOTE — ED Triage Notes (Signed)
Pt here for cough, congestion, chills and body aches since Saturday.

## 2016-11-10 NOTE — ED Provider Notes (Signed)
CSN: [id number]     Arrival date & time 11/10/16  1000 History   First MD Initiated Contact with Patient 11/10/16 1025     Chief Complaint  Patient presents with  . Cough  . Generalized Body Aches   (Consider location/radiation/quality/duration/timing/severity/associated sxs/prior Treatment) Patient c/o cough, congestion, and body aches for 5 days.   The history is provided by the patient.  Cough  Cough characteristics:  Productive Sputum characteristics:  White Severity:  Moderate Onset quality:  Sudden Duration:  5 days Timing:  Constant Progression:  Worsening Chronicity:  New Smoker: yes   Context: upper respiratory infection and weather changes   Relieved by:  Nothing Worsened by:  Nothing Associated symptoms: rhinorrhea, sinus congestion and wheezing     Past Medical History:  Diagnosis Date  . Hypertension    History reviewed. No pertinent surgical history. History reviewed. No pertinent family history. Social History  Substance Use Topics  . Smoking status: Current Some Day Smoker    Packs/day: 0.50    Years: 30.00    Types: Cigarettes  . Smokeless tobacco: Never Used  . Alcohol use No    Review of Systems  Constitutional: Positive for fatigue.  HENT: Positive for congestion, postnasal drip and rhinorrhea.   Eyes: Negative.   Respiratory: Positive for cough and wheezing.   Cardiovascular: Negative.   Endocrine: Negative.   Genitourinary: Negative.   Musculoskeletal: Negative.   Allergic/Immunologic: Negative.   Neurological: Negative.   Hematological: Negative.   Psychiatric/Behavioral: Negative.     Allergies  Patient has no known allergies.  Home Medications   Prior to Admission medications   Medication Sig Start Date End Date Taking? Authorizing Provider  azithromycin (ZITHROMAX) 250 MG tablet Take 1 tablet (250 mg total) by mouth daily. Take first 2 tablets together, then 1 every day until finished. 11/10/16   Deatra Canter, FNP   benzonatate (TESSALON) 100 MG capsule Take 2 capsules (200 mg total) by mouth 3 (three) times daily as needed for cough. 11/10/16   Deatra Canter, FNP  methylPREDNISolone (MEDROL DOSEPAK) 4 MG TBPK tablet Take 6-5-4-3-2-1 po qd 11/10/16   Deatra Canter, FNP  UNKNOWN TO PATIENT HTN med - unk name    Historical Provider, MD   Meds Ordered and Administered this Visit  Medications - No data to display  BP (!) 197/96   Pulse 91   Temp 98.6 F (37 C) (Oral)   Resp 18   SpO2 98%  No data found.   Physical Exam  Constitutional: He is oriented to person, place, and time. He appears well-developed and well-nourished.  HENT:  Head: Normocephalic and atraumatic.  Right Ear: External ear normal.  Left Ear: External ear normal.  Mouth/Throat: Oropharynx is clear and moist.  Eyes: Conjunctivae and EOM are normal. Pupils are equal, round, and reactive to light.  Neck: Normal range of motion. Neck supple.  Cardiovascular: Normal rate, regular rhythm and normal heart sounds.   Pulmonary/Chest: Effort normal. He has wheezes.  Abdominal: Soft. Bowel sounds are normal.  Musculoskeletal: Normal range of motion.  Neurological: He is alert and oriented to person, place, and time.  Nursing note and vitals reviewed.   Urgent Care Course     Procedures (including critical care time)  Labs Review Labs Reviewed - No data to display  Imaging Review No results found.   Visual Acuity Review  Right Eye Distance:   Left Eye Distance:   Bilateral Distance:    Right Eye Near:  Left Eye Near:    Bilateral Near:         MDM   1. Bronchitis   2. Cough    Zpak Tessalon Perles Medrol dose pack as directed  Push po fluids, rest, tylenol and motrin otc prn as directed for fever, arthralgias, and myalgias.  Follow up prn if sx's continue or persist.    Deatra Canter, FNP 11/10/16 1052

## 2016-11-22 ENCOUNTER — Encounter (HOSPITAL_COMMUNITY): Payer: Self-pay

## 2016-11-22 ENCOUNTER — Emergency Department (HOSPITAL_COMMUNITY)
Admission: EM | Admit: 2016-11-22 | Discharge: 2016-11-22 | Disposition: A | Discharge status: Home / Self Care | Payer: No Typology Code available for payment source | Attending: Emergency Medicine | Admitting: Emergency Medicine

## 2016-11-22 DIAGNOSIS — I10 Essential (primary) hypertension: Secondary | ICD-10-CM | POA: Diagnosis not present

## 2016-11-22 DIAGNOSIS — S39012A Strain of muscle, fascia and tendon of lower back, initial encounter: Secondary | ICD-10-CM

## 2016-11-22 DIAGNOSIS — Y999 Unspecified external cause status: Secondary | ICD-10-CM | POA: Insufficient documentation

## 2016-11-22 DIAGNOSIS — Y9389 Activity, other specified: Secondary | ICD-10-CM | POA: Insufficient documentation

## 2016-11-22 DIAGNOSIS — S3992XA Unspecified injury of lower back, initial encounter: Secondary | ICD-10-CM | POA: Diagnosis present

## 2016-11-22 DIAGNOSIS — Y92410 Unspecified street and highway as the place of occurrence of the external cause: Secondary | ICD-10-CM | POA: Insufficient documentation

## 2016-11-22 DIAGNOSIS — F17210 Nicotine dependence, cigarettes, uncomplicated: Secondary | ICD-10-CM | POA: Insufficient documentation

## 2016-11-22 MED ORDER — IBUPROFEN 600 MG PO TABS: 600.0000 mg | ORAL_TABLET | Freq: Four times a day (QID) | ORAL | 0 refills | Status: DC | PRN

## 2016-11-22 MED ORDER — METHOCARBAMOL 500 MG PO TABS: 500.0000 mg | ORAL_TABLET | Freq: Two times a day (BID) | ORAL | 0 refills | Status: DC

## 2016-11-22 MED ORDER — LIDOCAINE 5 % EX PTCH
1.0000 | MEDICATED_PATCH | CUTANEOUS | 0 refills | Status: DC
Start: 2016-11-22 — End: 2018-12-25

## 2016-11-22 NOTE — Discharge Instructions (Signed)
Expect your soreness to increase over the next 2-3 days. Take it easy, but do not lay around too much as this may make any stiffness worse.  °Antiinflammatory medications: Take 500 mg of naproxen every 12 hours or 600 mg of ibuprofen every 6 hours for the next 3 days. Take these medications with food to avoid upset stomach. Choose only one of these medications, do not take them together. ° °Muscle relaxer: Robaxin is a muscle relaxer and may help loosen stiff muscles. Do not take the Robaxin while driving or performing other dangerous activities.  ° °Lidocaine patches: These are available via either prescription or over-the-counter. The over-the-counter option may be more economical one and are likely just as effective. There are multiple over-the-counter brands, such as Salonpas. ° °Exercises: Be sure to perform the attached exercises starting with three times a week and working up to performing them daily. This is an essential part of preventing long term problems.  ° °Follow up with a primary care provider for any future management of these complaints. °
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
## 2018-12-25 ENCOUNTER — Ambulatory Visit (HOSPITAL_COMMUNITY)
Admission: EM | Admit: 2018-12-25 | Discharge: 2018-12-25 | Disposition: A | Discharge status: Home / Self Care | Payer: Self-pay | Attending: Family Medicine | Admitting: Family Medicine

## 2018-12-25 ENCOUNTER — Encounter (HOSPITAL_COMMUNITY): Payer: Self-pay

## 2018-12-25 ENCOUNTER — Other Ambulatory Visit: Payer: Self-pay

## 2018-12-25 DIAGNOSIS — R6883 Chills (without fever): Secondary | ICD-10-CM

## 2018-12-25 NOTE — ED Provider Notes (Signed)
[facility]    CSN: [id number] Arrival date & time: 12/25/18  9794     History   Chief Complaint Chief Complaint  Patient presents with  . Chills    HPI Wayne Clarke is a 58 y.o. male.   HPI   Patient had chills during the night 3 d ago.  He says the [facility] was on and he "might have been cold".  No other symptoms.  No symptoms or fever since. -cough -SOB --NVD -fever /chills -sore throat -exposure to illness   Past Medical History:  Diagnosis Date  . Hypertension     There are no active problems to display for this patient.   History reviewed. No pertinent surgical history.     Home Medications    Prior to Admission medications   Medication Sig Start Date End Date Taking? Authorizing Provider  ibuprofen (ADVIL,MOTRIN) 600 MG tablet Take 1 tablet (600 mg total) by mouth every 6 (six) hours as needed. 11/22/16   Anselm Pancoast, PA-C    Family History History reviewed. No pertinent family history.  Social History Social History   Tobacco Use  . Smoking status: Current Some Day Smoker    Packs/day: 0.50    Years: 30.00    Pack years: 15.00    Types: Cigarettes  . Smokeless tobacco: Never Used  Substance Use Topics  . Alcohol use: No  . Drug use: No     Allergies   Patient has no known allergies.   Review of Systems Review of Systems  Constitutional: Negative for chills and fever.  HENT: Negative for ear pain and sore throat.   Eyes: Negative for pain and visual disturbance.  Respiratory: Negative for cough and shortness of breath.   Cardiovascular: Negative for chest pain and palpitations.  Gastrointestinal: Negative for abdominal pain and vomiting.  Genitourinary: Negative for dysuria and hematuria.  Musculoskeletal: Negative for arthralgias and back pain.  Skin: Negative for color change and rash.  Neurological: Negative for seizures and syncope.  All other systems reviewed and are negative.    Physical Exam Triage  Vital Signs ED Triage Vitals  Enc Vitals Group     BP 12/25/18 1042 (!) 212/99     Pulse Rate 12/25/18 1042 73     Resp 12/25/18 1042 18     Temp 12/25/18 1042 99.3 F (37.4 C)     Temp Source 12/25/18 1042 Oral     SpO2 12/25/18 1042 100 %     Weight --      Height --      Head Circumference --      Peak Flow --      Pain Score 12/25/18 1040 0     Pain Loc --      Pain Edu? --      Excl. in GC? --    No data found.  Updated Vital Signs BP (S) (!) 199/149 (BP Location: Left Arm) Comment: Pt states he has not taken his BP meds in 4 days  Pulse 73   Temp 99.3 F (37.4 C) (Oral)   Resp 18   SpO2 100%   Visual Acuity Right Eye Distance:   Left Eye Distance:   Bilateral Distance:    Right Eye Near:   Left Eye Near:    Bilateral Near:     Physical Exam Constitutional:      General: He is not in acute distress.    Appearance: He is well-developed.  HENT:  Head: Normocephalic and atraumatic.  Eyes:     Conjunctiva/sclera: Conjunctivae normal.     Pupils: Pupils are equal, round, and reactive to light.  Neck:     Musculoskeletal: Normal range of motion.  Cardiovascular:     Rate and Rhythm: Normal rate.  Pulmonary:     Effort: Pulmonary effort is normal. No respiratory distress.  Abdominal:     General: There is no distension.     Palpations: Abdomen is soft.  Musculoskeletal: Normal range of motion.  Skin:    General: Skin is warm and dry.  Neurological:     Mental Status: He is alert.      UC Treatments / Results  Labs (all labs ordered are listed, but only abnormal results are displayed) Labs Reviewed - No data to display  EKG None  Radiology No results found.  Procedures Procedures (including critical care time)  Medications Ordered in UC Medications - No data to display  Initial Impression / Assessment and Plan / UC Course  I have reviewed the triage vital signs and the nursing notes.  Pertinent labs & imaging results that were  available during my care of the patient were reviewed by me and considered in my medical decision making (see chart for details).     No symptoms or findings to suggest infection  Final Clinical Impressions(s) / UC Diagnoses   Final diagnoses:  Chills (without fever)     Discharge Instructions     Your exam is normal No evidence of infection   ED Prescriptions    None     Controlled Substance Prescriptions [place] Controlled Substance Registry consulted? Not Applicable   Eustace MooreNelson, [place] Wysong Sue, MD 12/25/18 1421

## 2018-12-25 NOTE — ED Triage Notes (Signed)
Patient presents to Urgent Care with complaints of chills since 3 days ago. Patient reports he feels fine today, but his wife made him come in and get checked out. Pt denies fever, cough, or chills today. Pt states he was tested for COVID-19 2 weeks ago and the results were negative.

## 2018-12-25 NOTE — Discharge Instructions (Addendum)
Your exam is normal No evidence of infection

## 2019-09-28 ENCOUNTER — Encounter (HOSPITAL_COMMUNITY): Payer: Self-pay | Admitting: *Deleted

## 2019-09-28 ENCOUNTER — Emergency Department (HOSPITAL_COMMUNITY)
Admission: EM | Admit: 2019-09-28 | Discharge: 2019-09-28 | Disposition: A | Discharge status: Home / Self Care | Payer: Self-pay | Attending: Emergency Medicine | Admitting: Emergency Medicine

## 2019-09-28 ENCOUNTER — Other Ambulatory Visit: Payer: Self-pay

## 2019-09-28 DIAGNOSIS — I10 Essential (primary) hypertension: Secondary | ICD-10-CM | POA: Insufficient documentation

## 2019-09-28 DIAGNOSIS — F17210 Nicotine dependence, cigarettes, uncomplicated: Secondary | ICD-10-CM | POA: Insufficient documentation

## 2019-09-28 DIAGNOSIS — L989 Disorder of the skin and subcutaneous tissue, unspecified: Secondary | ICD-10-CM

## 2019-09-28 DIAGNOSIS — H029 Unspecified disorder of eyelid: Secondary | ICD-10-CM | POA: Insufficient documentation

## 2019-09-28 NOTE — ED Provider Notes (Signed)
[facility] EMERGENCY DEPARTMENT Provider Note   CSN: [id number] Arrival date & time: 09/28/19  0359     History Chief Complaint  Patient presents with  . Cyst    Wayne Clarke is a 59 y.o. male.  HPI     59 year old comes in a chief complaint of cyst to his left eye. Patient has history of hypertension.  He reports that lesion to his left eyelid has been present for about 2 months now.  There is no pain or increase in size, however he would like it to be removed.  He has not seen his PCP for it.  No history of prior cyst that needed to be removed.  Patient denies any trauma.  Past Medical History:  Diagnosis Date  . Hypertension     There are no problems to display for this patient.   History reviewed. No pertinent surgical history.     No family history on file.  Social History   Tobacco Use  . Smoking status: Current Some Day Smoker    Packs/day: 0.50    Years: 30.00    Pack years: 15.00    Types: Cigarettes  . Smokeless tobacco: Never Used  Substance Use Topics  . Alcohol use: No  . Drug use: No    Home Medications Prior to Admission medications   Medication Sig Start Date End Date Taking? Authorizing Provider  ibuprofen (ADVIL,MOTRIN) 600 MG tablet Take 1 tablet (600 mg total) by mouth every 6 (six) hours as needed. 11/22/16   Joy, Hillard Danker, PA-C    Allergies    Patient has no known allergies.  Review of Systems   Review of Systems  Constitutional: Negative for activity change.  Skin: Positive for rash.    Physical Exam Updated Vital Signs BP (!) 144/110 (BP Location: Right Arm)   Pulse 87   Temp 98.3 F (36.8 C) (Oral)   Resp 16   SpO2 94%   Physical Exam Vitals and nursing note reviewed.  Constitutional:      Appearance: He is well-developed.  HENT:     Head: Atraumatic.  Eyes:     Comments: Patient has a 5 to 10 mm lesion that is nonfluctuant and mobile.  No surrounding erythema or tenderness to palpation   Cardiovascular:     Rate and Rhythm: Normal rate.  Pulmonary:     Effort: Pulmonary effort is normal.  Musculoskeletal:     Cervical back: Neck supple.  Skin:    General: Skin is warm.  Neurological:     Mental Status: He is alert and oriented to person, place, and time.     ED Results / Procedures / Treatments   Labs (all labs ordered are listed, but only abnormal results are displayed) Labs Reviewed - No data to display  EKG None  Radiology No results found.  Procedures Procedures (including critical care time)  Medications Ordered in ED Medications - No data to display  ED Course  I have reviewed the triage vital signs and the nursing notes.  Pertinent labs & imaging results that were available during my care of the patient were reviewed by me and considered in my medical decision making (see chart for details).    MDM Rules/Calculators/A&P                      Patient comes in with chief complaint of cyst.  He has benign appearing cyst to his left eyelid.  Could  be a cyst or lipoma. No need for emergent treatment.  It is not infected.  We will give him follow-up with plastics.  Final Clinical Impression(s) / ED Diagnoses Final diagnoses:  Facial skin lesion    Rx / DC Orders ED Discharge Orders    None       Varney Biles, MD 09/28/19 [phone]

## 2019-09-28 NOTE — ED Triage Notes (Signed)
Pt reporting a knot over his left eyebrow for about 2-3 months.

## 2019-09-28 NOTE — ED Notes (Signed)
Patient verbalizes understanding of discharge instructions. Opportunity for questioning and answers were provided. Armband removed by staff, pt discharged from ED.  

## 2019-09-28 NOTE — Discharge Instructions (Signed)
You have a lesion to your face that is likely benign. If it is giving you problems then please consider seeing the plastic surgeon as recommended for further management.

## 2022-01-28 ENCOUNTER — Ambulatory Visit: Payer: Self-pay

## 2022-01-28 NOTE — Telephone Encounter (Signed)
Summary: swollen knee   Patient looking to est care and states his rt knee is swollen w/ possible fluid, patient states periodically his knee hurts when weight is applied.   Please assist patient further     Pt refused triage. Pt stated he wants a PCP.  Denies he has medicaid or medicare. Pt has private insurance. Pt stated he will look for his insurance card and NT will call back. Called pt and appt made for him with Dr. Janee Morn at 1300. Reviewed late and cancellation fee.

## 2022-02-01 ENCOUNTER — Ambulatory Visit: Payer: Managed Care, Other (non HMO) | Admitting: Family Medicine

## 2022-02-01 ENCOUNTER — Ambulatory Visit: Payer: Managed Care, Other (non HMO)

## 2022-02-01 ENCOUNTER — Ambulatory Visit (INDEPENDENT_AMBULATORY_CARE_PROVIDER_SITE_OTHER): Payer: Managed Care, Other (non HMO)

## 2022-02-01 ENCOUNTER — Encounter: Payer: Self-pay | Admitting: Family Medicine

## 2022-02-01 VITALS — BP 160/110 | HR 75 | Temp 97.3°F | Ht 66.5 in | Wt 212.6 lb

## 2022-02-01 DIAGNOSIS — I10 Essential (primary) hypertension: Secondary | ICD-10-CM | POA: Diagnosis not present

## 2022-02-01 DIAGNOSIS — M25561 Pain in right knee: Secondary | ICD-10-CM

## 2022-02-01 DIAGNOSIS — M25562 Pain in left knee: Secondary | ICD-10-CM | POA: Insufficient documentation

## 2022-02-01 DIAGNOSIS — R6883 Chills (without fever): Secondary | ICD-10-CM | POA: Diagnosis not present

## 2022-02-01 DIAGNOSIS — R059 Cough, unspecified: Secondary | ICD-10-CM | POA: Diagnosis not present

## 2022-02-01 LAB — POCT INFLUENZA A/B
Influenza A, POC: NEGATIVE
Influenza B, POC: NEGATIVE

## 2022-02-01 LAB — POC COVID19 BINAXNOW: SARS Coronavirus 2 Ag: NEGATIVE

## 2022-02-01 MED ORDER — BENZONATATE 100 MG PO CAPS: 100.0000 mg | ORAL_CAPSULE | Freq: Two times a day (BID) | ORAL | 0 refills | Status: DC | PRN

## 2022-02-01 MED ORDER — NAPROXEN 500 MG PO TABS: 500.0000 mg | ORAL_TABLET | Freq: Two times a day (BID) | ORAL | 0 refills | Status: AC
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
## 2022-02-01 NOTE — Assessment & Plan Note (Signed)
Likely mild osteoarthritis given age Knee x-ray Trial naproxen Follow-up as needed

## 2022-02-01 NOTE — Progress Notes (Unsigned)
Initial visit. Rt knee pain x 1 week.  Denies injury.

## 2022-02-11 ENCOUNTER — Ambulatory Visit: Payer: Managed Care, Other (non HMO) | Admitting: Family Medicine

## 2022-08-23 ENCOUNTER — Encounter (HOSPITAL_COMMUNITY): Payer: Self-pay

## 2022-08-23 ENCOUNTER — Ambulatory Visit (HOSPITAL_COMMUNITY)
Admission: EM | Admit: 2022-08-23 | Discharge: 2022-08-23 | Disposition: A | Discharge status: Home / Self Care | Payer: Managed Care, Other (non HMO) | Attending: Nurse Practitioner | Admitting: Nurse Practitioner

## 2022-08-23 DIAGNOSIS — J069 Acute upper respiratory infection, unspecified: Secondary | ICD-10-CM | POA: Diagnosis not present

## 2022-08-23 DIAGNOSIS — I160 Hypertensive urgency: Secondary | ICD-10-CM

## 2022-08-23 DIAGNOSIS — I10 Essential (primary) hypertension: Secondary | ICD-10-CM

## 2022-08-23 MED ORDER — CLONIDINE HCL 0.1 MG PO TABS
0.1000 mg | ORAL_TABLET | Freq: Once | ORAL | Status: AC
  Administered 2022-08-23: 0.1 mg via ORAL

## 2022-08-23 MED ORDER — CLONIDINE HCL 0.1 MG PO TABS
ORAL_TABLET | ORAL | Status: AC
  Filled 2022-08-23: qty 2

## 2022-08-23 MED ORDER — BENZONATATE 100 MG PO CAPS: 200.0000 mg | ORAL_CAPSULE | Freq: Three times a day (TID) | ORAL | 0 refills | Status: DC | PRN

## 2022-08-23 MED ORDER — CLONIDINE HCL 0.1 MG PO TABS
ORAL_TABLET | ORAL | Status: AC
  Filled 2022-08-23: qty 1

## 2022-08-23 MED ORDER — LISINOPRIL-HYDROCHLOROTHIAZIDE 20-25 MG PO TABS: 1.0000 | ORAL_TABLET | Freq: Every day | ORAL | 0 refills | Status: DC

## 2022-08-23 MED ORDER — AZITHROMYCIN 250 MG PO TABS: 250.0000 mg | ORAL_TABLET | Freq: Every day | ORAL | 0 refills | Status: DC

## 2022-08-23 MED ORDER — CLONIDINE HCL 0.1 MG PO TABS
0.2000 mg | ORAL_TABLET | Freq: Once | ORAL | Status: AC
  Administered 2022-08-23: 0.2 mg via ORAL

## 2022-08-23 NOTE — Discharge Instructions (Addendum)
High blood pressure is when the force of blood pumping through the arteries is too strong. The arteries are the blood vessels that carry blood from the heart throughout the body. Hypertension forces the heart to work harder to pump blood and may cause arteries to become narrow or stiff. Your blood pressure was elevated today. We gave you medication to help bring it down but it is still elevated. You decided not to go to the emergency room for further treatment. It is important that you start taking the prescribed blood pressure medicine. Managing your hypertension is very important. Over time, hypertension can damage the arteries and decrease blood flow to parts of the body, including the brain, heart, and kidneys. Having untreated or uncontrolled hypertension can lead to heart attack, stroke, weakened blood vessels (aneurysm), heart failure, kidney damage, eye damage, memory/concentration problems and vascular dementia. Please monitor your blood pressure closely and take your medications as prescribed. Go to the ED immediately if you develop a severe headache, dizziness, sudden vision problems, confusion, experience unusual weakness or numbness, severe pain in your chest or abdomen, you vomit repeatedly or have trouble breathing.

## 2022-08-23 NOTE — ED Provider Notes (Signed)
[facility]    CSN: [id number] Arrival date & time: 08/23/22  1239      History   Chief Complaint Chief Complaint  Patient presents with   Nasal Congestion    HPI Wayne Clarke is a 62 y.o. male.   Subjective:  Wayne Clarke is a 62 y.o. male who presents for evaluation of symptoms of a URI. Symptoms include suspected fevers but not measured at home, chest congestion, chills, nasal discharge, productive cough, and occasional shortness of breath. Onset of symptoms was 1-2 weeks ago and has been unchanged since that time. He denies any sore throat, headache, dizziness, blurred vision, nausea, vomiting, diarrhea, chest pain, palpitations, facial/limb paraesthesias, wheezing or leg swelling.  He is drinking plenty of fluids. He has tried Mucinex and Advil for his symptoms.   The following portions of the patient's history were reviewed and updated as appropriate: allergies, current medications, past family history, past medical history, past social history, past surgical history, and problem list.         Past Medical History:  Diagnosis Date   Hypertension     Patient Active Problem List   Diagnosis Date Noted   Cough 02/01/2022   Primary hypertension 02/01/2022   Right knee pain 02/01/2022    History reviewed. No pertinent surgical history.     Home Medications    Prior to Admission medications   Medication Sig Start Date End Date Taking? Authorizing Provider  azithromycin (ZITHROMAX) 250 MG tablet Take 1 tablet (250 mg total) by mouth daily. Take first 2 tablets together, then 1 every day until finished. 08/23/22  Yes Lurline Idol, FNP  benzonatate (TESSALON) 100 MG capsule Take 2 capsules (200 mg total) by mouth 3 (three) times daily as needed for cough. 08/23/22  Yes Lurline Idol, FNP  lisinopril-hydrochlorothiazide (ZESTORETIC) 20-25 MG tablet Take 1 tablet by mouth daily. 08/23/22 09/22/22 Yes Lurline Idol, FNP    Family  History History reviewed. No pertinent family history.  Social History Social History   Tobacco Use   Smoking status: Some Days    Packs/day: 1.00    Years: 30.00    Total pack years: 30.00    Types: Cigarettes   Smokeless tobacco: Never  Vaping Use   Vaping Use: Never used  Substance Use Topics   Alcohol use: No   Drug use: No     Allergies   Patient has no known allergies.   Review of Systems Review of Systems  Constitutional:  Positive for chills, fatigue and fever. Negative for diaphoresis.  HENT:  Positive for rhinorrhea. Negative for congestion, sneezing and sore throat.   Respiratory:  Positive for cough and shortness of breath.   Cardiovascular:  Negative for chest pain, palpitations and leg swelling.  Gastrointestinal:  Negative for diarrhea, nausea and vomiting.  Neurological:  Negative for dizziness and headaches.     Physical Exam Triage Vital Signs ED Triage Vitals [08/23/22 1505]  Enc Vitals Group     BP (!) 225/122     Pulse Rate 76     Resp 16     Temp 98.3 F (36.8 C)     Temp Source Oral     SpO2 98 %     Weight      Height      Head Circumference      Peak Flow      Pain Score 4     Pain Loc      Pain Edu?  Excl. in [place]?    No data found.  Updated Vital Signs BP (!) 194/91 (BP Location: Left Arm)   Pulse 97   Temp 98 F (36.7 C)   Resp 12   SpO2 97%   Visual Acuity Right Eye Distance:   Left Eye Distance:   Bilateral Distance:    Right Eye Near:   Left Eye Near:    Bilateral Near:     Physical Exam Vitals reviewed.  Constitutional:      General: He is not in acute distress.    Appearance: Normal appearance. He is not ill-appearing, toxic-appearing or diaphoretic.  HENT:     Head: Normocephalic.     Nose: Nose normal.     Mouth/Throat:     Mouth: Mucous membranes are moist.  Eyes:     Conjunctiva/sclera: Conjunctivae normal.  Cardiovascular:     Rate and Rhythm: Normal rate and regular rhythm.  Pulmonary:      Effort: Pulmonary effort is normal.     Breath sounds: Normal breath sounds.  Musculoskeletal:        General: Normal range of motion.     Cervical back: Normal range of motion and neck supple.  Skin:    General: Skin is warm and dry.  Neurological:     General: No focal deficit present.     Mental Status: He is alert and oriented to person, place, and time.     Cranial Nerves: No cranial nerve deficit.  Psychiatric:        Mood and Affect: Mood normal.        Behavior: Behavior normal.        Thought Content: Thought content normal.      UC Treatments / Results  Labs (all labs ordered are listed, but only abnormal results are displayed) Labs Reviewed - No data to display  EKG   Radiology No results found.  Procedures Procedures (including critical care time)  Medications Ordered in UC Medications  cloNIDine (CATAPRES) tablet 0.2 mg (0.2 mg Oral Given 08/23/22 1524)  cloNIDine (CATAPRES) tablet 0.1 mg (0.1 mg Oral Given 08/23/22 1638)    Initial Impression / Assessment and Plan / UC Course  I have reviewed the triage vital signs and the nursing notes.  Pertinent labs & imaging results that were available during my care of the patient were reviewed by me and considered in my medical decision making (see chart for details).    62 year old male presenting with a 1 to 2-week history of URI.  Patient is afebrile and nontoxic.  However, he is extremely hypertensive with an initial blood pressure of 225/122. Patient reports a history of hypertension but has not taken anything for his blood pressure in several years.  He denies any history of heart disease, stroke or prior MI.  Does not have a primary care provider.  He smokes 1 pack of cigarettes daily.  Clonidine 0.2 mg given in clinic with minimally improved blood pressure of 213/121.  Patient irritated and ready to leave.  Advised patient that his blood pressure is dangerously high and recommended that he reports to the ED  for further treatment.  Patient declined.  Administered an additional 0.1 mg of clonidine. BP recheck was 194/91. Patient declined ED evaluation. Will discharge home with Rx for lisinopril-HCTZ.  Advised patient to monitor BP closely.  Sensitively discussed indications for immediate ED evaluation.  PCP follow-up as soon as possible. Also discussed diagnosis and treatment of URI.  Today's evaluation has revealed  no signs of a dangerous process. Discussed diagnosis with patient and/or guardian. Patient and/or guardian aware of their diagnosis, possible red flag symptoms to watch out for and need for close follow up. Patient and/or guardian understands verbal and written discharge instructions. Patient and/or guardian comfortable with plan and disposition.  Patient and/or guardian has a clear mental status at this time, good insight into illness (after discussion and teaching) and has clear judgment to make decisions regarding their care  Documentation was completed with the aid of voice recognition software. Transcription may contain typographical errors. Final Clinical Impressions(s) / UC Diagnoses   Final diagnoses:  Asymptomatic hypertensive urgency  Acute upper respiratory infection     Discharge Instructions      High blood pressure is when the force of blood pumping through the arteries is too strong. The arteries are the blood vessels that carry blood from the heart throughout the body. Hypertension forces the heart to work harder to pump blood and may cause arteries to become narrow or stiff. Your blood pressure was elevated today. We gave you medication to help bring it down but it is still elevated. You decided not to go to the emergency room for further treatment. It is important that you start taking the prescribed blood pressure medicine. Managing your hypertension is very important. Over time, hypertension can damage the arteries and decrease blood flow to parts of the body, including the  brain, heart, and kidneys. Having untreated or uncontrolled hypertension can lead to heart attack, stroke, weakened blood vessels (aneurysm), heart failure, kidney damage, eye damage, memory/concentration problems and vascular dementia. Please monitor your blood pressure closely and take your medications as prescribed. Go to the ED immediately if you develop a severe headache, dizziness, sudden vision problems, confusion, experience unusual weakness or numbness, severe pain in your chest or abdomen, you vomit repeatedly or have trouble breathing.       ED Prescriptions     Medication Sig Dispense Auth. Provider   lisinopril-hydrochlorothiazide (ZESTORETIC) 20-25 MG tablet Take 1 tablet by mouth daily. 30 tablet Enrique Sack, FNP   azithromycin (ZITHROMAX) 250 MG tablet Take 1 tablet (250 mg total) by mouth daily. Take first 2 tablets together, then 1 every day until finished. 6 tablet Enrique Sack, FNP   benzonatate (TESSALON) 100 MG capsule Take 2 capsules (200 mg total) by mouth 3 (three) times daily as needed for cough. 21 capsule Enrique Sack, FNP      PDMP not reviewed this encounter.   Enrique Sack, [place] 08/23/22 [phone]

## 2022-08-23 NOTE — ED Triage Notes (Signed)
Pt is here for symptoms with RSV, nasal congestion, runny nose, watery eyes, knees swollen x 2wks

## 2022-08-30 ENCOUNTER — Other Ambulatory Visit: Payer: Self-pay

## 2022-08-30 ENCOUNTER — Ambulatory Visit (INDEPENDENT_AMBULATORY_CARE_PROVIDER_SITE_OTHER): Payer: Managed Care, Other (non HMO)

## 2022-08-30 ENCOUNTER — Encounter (HOSPITAL_COMMUNITY): Payer: Self-pay

## 2022-08-30 ENCOUNTER — Ambulatory Visit (HOSPITAL_COMMUNITY)
Admission: RE | Admit: 2022-08-30 | Discharge: 2022-08-30 | Disposition: A | Discharge status: Home / Self Care | Payer: Managed Care, Other (non HMO) | Source: Ambulatory Visit | Attending: Family Medicine | Admitting: Family Medicine

## 2022-08-30 VITALS — BP 216/103 | HR 73 | Temp 98.1°F | Resp 22

## 2022-08-30 DIAGNOSIS — I10 Essential (primary) hypertension: Secondary | ICD-10-CM | POA: Diagnosis not present

## 2022-08-30 DIAGNOSIS — R059 Cough, unspecified: Secondary | ICD-10-CM | POA: Diagnosis not present

## 2022-08-30 DIAGNOSIS — M7989 Other specified soft tissue disorders: Secondary | ICD-10-CM | POA: Diagnosis not present

## 2022-08-30 DIAGNOSIS — R0989 Other specified symptoms and signs involving the circulatory and respiratory systems: Secondary | ICD-10-CM

## 2022-08-30 DIAGNOSIS — J441 Chronic obstructive pulmonary disease with (acute) exacerbation: Secondary | ICD-10-CM

## 2022-08-30 MED ORDER — PREDNISONE 20 MG PO TABS: 40.0000 mg | ORAL_TABLET | Freq: Every day | ORAL | 0 refills | Status: AC

## 2022-08-30 MED ORDER — AMLODIPINE BESYLATE 5 MG PO TABS: 5.0000 mg | ORAL_TABLET | Freq: Every day | ORAL | 0 refills | Status: DC

## 2022-08-30 MED ORDER — DOXYCYCLINE HYCLATE 100 MG PO CAPS: 100.0000 mg | ORAL_CAPSULE | Freq: Two times a day (BID) | ORAL | 0 refills | Status: AC

## 2022-08-30 MED ORDER — ALBUTEROL SULFATE HFA 108 (90 BASE) MCG/ACT IN AERS: 2.0000 | INHALATION_SPRAY | RESPIRATORY_TRACT | 0 refills | Status: AC | PRN

## 2022-08-30 NOTE — ED Triage Notes (Signed)
Patient lists complaints of uri and leg pain.    Reports chest congestion that has continued since being seen 08/23/2022 for the same.  Continues complaining if watery eyes, runny nose.     Left leg swelling noticed yesterday.  Patient reports swelling from mid left thigh down to his toes .  Reports a history of the same , but no diagnosis.

## 2022-08-30 NOTE — ED Provider Notes (Signed)
[place]    CSN: [id number] Arrival date & time: 08/30/22  [place]      History   Chief Complaint Chief Complaint  Patient presents with   Appointment    17:00   Leg Pain   URI    HPI Wayne Clarke is a 62 y.o. male.    Leg Pain URI  Here for bad" congestion.  He has had cough and congestion for about a month.  He was seen here January 15 and he was prescribed a prescription for a Z-Pak and Tessalon Perles.  He may be did start improving a few days ago, and then worsened again yesterday.  He has subjective fever and is coughing a lot.  No headache.  Also his left leg is swelling in the last 2 weeks.  It is fairly tense.  Staff started him on lisinopril HCT when he was seen here a week ago.  He states he is taking it but blood pressure is still 216/103.  There was a misunderstanding during last visit and it was thought that he did not have a PCP.  Apparently he does --a provider named Grandville Silos  Past Medical History:  Diagnosis Date   Hypertension     Patient Active Problem List   Diagnosis Date Noted   Cough 02/01/2022   Primary hypertension 02/01/2022   Right knee pain 02/01/2022    History reviewed. No pertinent surgical history.     Home Medications    Prior to Admission medications   Medication Sig Start Date End Date Taking? Authorizing Provider  albuterol (VENTOLIN HFA) 108 (90 Base) MCG/ACT inhaler Inhale 2 puffs into the lungs every 4 (four) hours as needed for wheezing or shortness of breath. 08/30/22  Yes Kinze Labo, Gwenlyn Perking, MD  amLODipine (NORVASC) 5 MG tablet Take 1 tablet (5 mg total) by mouth daily. 08/30/22  Yes Barrett Henle, MD  doxycycline (VIBRAMYCIN) 100 MG capsule Take 1 capsule (100 mg total) by mouth 2 (two) times daily for 7 days. 08/30/22 09/06/22 Yes Viviana Trimble, Gwenlyn Perking, MD  predniSONE (DELTASONE) 20 MG tablet Take 2 tablets (40 mg total) by mouth daily with breakfast for 5 days. 08/30/22 09/04/22 Yes Nussen Pullin, Gwenlyn Perking,  MD  lisinopril-hydrochlorothiazide (ZESTORETIC) 20-25 MG tablet Take 1 tablet by mouth daily. 08/23/22 09/22/22  Enrique Sack, FNP    Family History History reviewed. No pertinent family history.  Social History Social History   Tobacco Use   Smoking status: Some Days    Packs/day: 1.00    Years: 30.00    Total pack years: 30.00    Types: Cigarettes   Smokeless tobacco: Never  Vaping Use   Vaping Use: Never used  Substance Use Topics   Alcohol use: Yes   Drug use: No     Allergies   Patient has no known allergies.   Review of Systems Review of Systems   Physical Exam Triage Vital Signs ED Triage Vitals  Enc Vitals Group     BP 08/30/22 1708 (!) 216/103     Pulse Rate 08/30/22 1708 73     Resp 08/30/22 1708 (!) 22     Temp 08/30/22 1708 98.1 F (36.7 C)     Temp Source 08/30/22 1708 Oral     SpO2 08/30/22 1708 96 %     Weight --      Height --      Head Circumference --      Peak Flow --  Pain Score 08/30/22 1706 6     Pain Loc --      Pain Edu? --      Excl. in [place]? --    No data found.  Updated Vital Signs BP (!) 216/103 (BP Location: Left Arm) Comment (BP Location): large cuff  Pulse 73   Temp 98.1 F (36.7 C) (Oral)   Resp (!) 22   SpO2 96%   Visual Acuity Right Eye Distance:   Left Eye Distance:   Bilateral Distance:    Right Eye Near:   Left Eye Near:    Bilateral Near:     Physical Exam Vitals reviewed.  Constitutional:      General: He is not in acute distress.    Appearance: He is not toxic-appearing.  HENT:     Right Ear: Tympanic membrane and ear canal normal.     Left Ear: Tympanic membrane and ear canal normal.     Nose: Nose normal.     Mouth/Throat:     Mouth: Mucous membranes are moist.     Pharynx: No oropharyngeal exudate or posterior oropharyngeal erythema.  Eyes:     Extraocular Movements: Extraocular movements intact.     Conjunctiva/sclera: Conjunctivae normal.     Pupils: Pupils are equal, round, and  reactive to light.  Cardiovascular:     Rate and Rhythm: Normal rate and regular rhythm.     Heart sounds: No murmur heard. Pulmonary:     Effort: No respiratory distress.     Breath sounds: No stridor. No rhonchi or rales.     Comments: He has some low pitched expiratory wheezes bilaterally.  Air movement is good. Musculoskeletal:     Cervical back: Neck supple.     Comments: There is trace edema of the right lower extremity.  There is tense 2+ edema of the left lower leg.  With calf tenderness.  Lymphadenopathy:     Cervical: No cervical adenopathy.  Skin:    Capillary Refill: Capillary refill takes less than 2 seconds.     Coloration: Skin is not jaundiced or pale.  Neurological:     General: No focal deficit present.     Mental Status: He is alert and oriented to person, place, and time.  Psychiatric:        Behavior: Behavior normal.      UC Treatments / Results  Labs (all labs ordered are listed, but only abnormal results are displayed) Labs Reviewed - No data to display  EKG   Radiology DG Chest 2 View  Result Date: 08/30/2022 CLINICAL DATA:  Cough and chest congestion for the past month. EXAM: CHEST - 2 VIEW COMPARISON:  Chest x-ray dated September 12, 2010. FINDINGS: New mild cardiomegaly. Normal pulmonary vascularity. No focal consolidation, pleural effusion, or pneumothorax. No acute osseous abnormality. IMPRESSION: 1. New mild cardiomegaly. No acute cardiopulmonary disease. Electronically Signed   By: Titus Dubin M.D.   On: 08/30/2022 18:03    Procedures Procedures (including critical care time)  Medications Ordered in UC Medications - No data to display  Initial Impression / Assessment and Plan / UC Course  I have reviewed the triage vital signs and the nursing notes.  Pertinent labs & imaging results that were available during my care of the patient were reviewed by me and considered in my medical decision making (see chart for details).         X-ray does not show any infiltrate; there is some new mild cardiomegaly.  No interstitial  fluid.  I am going to treat for COPD exacerbation since he is wheezing on exam.  Also venous Dopplers ordered of his left leg.  It should be done tomorrow Final Clinical Impressions(s) / UC Diagnoses   Final diagnoses:  COPD exacerbation (HCC)  Essential hypertension  Left leg swelling     Discharge Instructions      Chest x-ray did not show any pneumonia or fluid.  Albuterol inhaler--do 2 puffs every 4 hours as needed for shortness of breath or wheezing  Take doxycycline 100 mg --1 capsule 2 times daily for 7 days  Staff will let you know if you need any treatment depending on what the venous Doppler shows.  Take prednisone 20 mg--2 daily for 5 days  Amlodipine 5 mg--1 tablet daily for blood pressure.  Continue taking the lisinopril HCT that was sent in a week ago.  Please also make an appointment for follow-up of your blood pressure with your primary care       ED Prescriptions     Medication Sig Dispense Auth. Provider   albuterol (VENTOLIN HFA) 108 (90 Base) MCG/ACT inhaler Inhale 2 puffs into the lungs every 4 (four) hours as needed for wheezing or shortness of breath. 1 each Zenia Resides, MD   predniSONE (DELTASONE) 20 MG tablet Take 2 tablets (40 mg total) by mouth daily with breakfast for 5 days. 10 tablet Zenia Resides, MD   doxycycline (VIBRAMYCIN) 100 MG capsule Take 1 capsule (100 mg total) by mouth 2 (two) times daily for 7 days. 14 capsule Zenia Resides, MD   amLODipine (NORVASC) 5 MG tablet Take 1 tablet (5 mg total) by mouth daily. 30 tablet Dilon Lank, Janace Aris, MD      PDMP not reviewed this encounter.   Zenia Resides, MD 08/30/22 [phone]

## 2022-08-30 NOTE — ED Notes (Signed)
Notified dr Windy Carina of blood pressure

## 2022-08-30 NOTE — Discharge Instructions (Addendum)
Chest x-ray did not show any pneumonia or fluid.  Albuterol inhaler--do 2 puffs every 4 hours as needed for shortness of breath or wheezing  Take doxycycline 100 mg --1 capsule 2 times daily for 7 days  Staff will let you know if you need any treatment depending on what the venous Doppler shows.  Take prednisone 20 mg--2 daily for 5 days  Amlodipine 5 mg--1 tablet daily for blood pressure.  Continue taking the lisinopril HCT that was sent in a week ago.  Please also make an appointment for follow-up of your blood pressure with your primary care

## 2022-08-31 ENCOUNTER — Telehealth (HOSPITAL_COMMUNITY): Payer: Self-pay | Admitting: Family Medicine

## 2022-08-31 ENCOUNTER — Ambulatory Visit (HOSPITAL_COMMUNITY)
Admission: RE | Admit: 2022-08-31 | Discharge: 2022-08-31 | Disposition: A | Discharge status: Home / Self Care | Payer: Managed Care, Other (non HMO) | Source: Ambulatory Visit | Attending: Family Medicine | Admitting: Family Medicine

## 2022-08-31 DIAGNOSIS — M79609 Pain in unspecified limb: Secondary | ICD-10-CM | POA: Diagnosis not present

## 2022-08-31 DIAGNOSIS — M79669 Pain in unspecified lower leg: Secondary | ICD-10-CM | POA: Insufficient documentation

## 2022-08-31 NOTE — Telephone Encounter (Signed)
Called patient and after verifying identity, we discussed the results of his venous Doppler done this morning.  There was no DVT, but there was a cystic structure seen in the popliteal fossa.  I discussed this result with the patient, and let him know that he needs to see his primary care and he may need to see orthopedics about this cyst in his popliteal fossa.  He stated that he was incapacitated and unable to write down information today.  He does not have MyChart set up.  He requested that I text him the contact information for orthopedics.  Joellen Jersey, would you please mail him a note with contact information for [facility] at their [place] location?  I think it best for patient privacy (and probably mine), that I don't text him from my personal cell phone.  Thank you

## 2022-08-31 NOTE — Progress Notes (Signed)
Lower extremity venous left study completed.  Preliminary results relayed to Anthony with [facility] for Windy Carina, MD.   See CV Proc for preliminary results report.   Darlin Coco, RDMS, RVT

## 2022-09-02 ENCOUNTER — Ambulatory Visit: Payer: Managed Care, Other (non HMO) | Admitting: Family Medicine

## 2022-09-02 ENCOUNTER — Encounter: Payer: Self-pay | Admitting: Family Medicine

## 2022-09-02 VITALS — BP 222/124 | HR 76 | Temp 98.0°F | Ht 66.5 in | Wt 214.0 lb

## 2022-09-02 DIAGNOSIS — Z720 Tobacco use: Secondary | ICD-10-CM | POA: Diagnosis not present

## 2022-09-02 DIAGNOSIS — G8929 Other chronic pain: Secondary | ICD-10-CM

## 2022-09-02 DIAGNOSIS — I1A0 Resistant hypertension: Secondary | ICD-10-CM | POA: Insufficient documentation

## 2022-09-02 DIAGNOSIS — M25562 Pain in left knee: Secondary | ICD-10-CM

## 2022-09-02 DIAGNOSIS — J441 Chronic obstructive pulmonary disease with (acute) exacerbation: Secondary | ICD-10-CM

## 2022-09-02 DIAGNOSIS — M7122 Synovial cyst of popliteal space [Baker], left knee: Secondary | ICD-10-CM

## 2022-09-02 DIAGNOSIS — I7090 Unspecified atherosclerosis: Secondary | ICD-10-CM

## 2022-09-02 LAB — URINALYSIS, ROUTINE W REFLEX MICROSCOPIC
Bilirubin Urine: NEGATIVE
Ketones, ur: NEGATIVE
Leukocytes,Ua: NEGATIVE
Nitrite: NEGATIVE
Specific Gravity, Urine: 1.025 (ref 1.000–1.030)
Total Protein, Urine: NEGATIVE
Urine Glucose: NEGATIVE
Urobilinogen, UA: 0.2 (ref 0.0–1.0)
pH: 6 (ref 5.0–8.0)

## 2022-09-02 LAB — COMPREHENSIVE METABOLIC PANEL
ALT: 22 U/L (ref 0–53)
AST: 20 U/L (ref 0–37)
Albumin: 4.2 g/dL (ref 3.5–5.2)
Alkaline Phosphatase: 56 U/L (ref 39–117)
BUN: 16 mg/dL (ref 6–23)
CO2: 28 mEq/L (ref 19–32)
Calcium: 9.2 mg/dL (ref 8.4–10.5)
Chloride: 102 mEq/L (ref 96–112)
Creatinine, Ser: 1.13 mg/dL (ref 0.40–1.50)
GFR: 70.61 mL/min (ref >60.00)
Glucose, Bld: 90 mg/dL (ref 70–99)
Potassium: 4.1 mEq/L (ref 3.5–5.1)
Sodium: 138 mEq/L (ref 135–145)
Total Bilirubin: 0.5 mg/dL (ref 0.2–1.2)
Total Protein: 6.7 g/dL (ref 6.0–8.3)

## 2022-09-02 LAB — LIPID PANEL
Cholesterol: 266 mg/dL — ABNORMAL HIGH (ref 0–200)
HDL: 43 mg/dL (ref >39.00)
NonHDL: 223
Total CHOL/HDL Ratio: 6
Triglycerides: 252 mg/dL — ABNORMAL HIGH (ref 0.0–149.0)
VLDL: 50.4 mg/dL — ABNORMAL HIGH (ref 0.0–40.0)

## 2022-09-02 LAB — CBC WITH DIFFERENTIAL/PLATELET
Basophils Absolute: 0.1 10*3/uL (ref 0.0–0.1)
Basophils Relative: 0.8 % (ref 0.0–3.0)
Eosinophils Absolute: 0.2 10*3/uL (ref 0.0–0.7)
Eosinophils Relative: 1.9 % (ref 0.0–5.0)
HCT: 45.7 % (ref 39.0–52.0)
Hemoglobin: 15.6 g/dL (ref 13.0–17.0)
Lymphocytes Relative: 21.6 % (ref 12.0–46.0)
Lymphs Abs: 1.9 10*3/uL (ref 0.7–4.0)
MCHC: 34.1 g/dL (ref 30.0–36.0)
MCV: 89.5 fl (ref 78.0–100.0)
Monocytes Absolute: 0.6 10*3/uL (ref 0.1–1.0)
Monocytes Relative: 7.2 % (ref 3.0–12.0)
Neutro Abs: 5.9 10*3/uL (ref 1.4–7.7)
Neutrophils Relative %: 68.5 % (ref 43.0–77.0)
Platelets: 260 10*3/uL (ref 150.0–400.0)
RBC: 5.11 Mil/uL (ref 4.22–5.81)
RDW: 14 % (ref 11.5–15.5)
WBC: 8.6 10*3/uL (ref 4.0–10.5)

## 2022-09-02 LAB — LDL CHOLESTEROL, DIRECT: Direct LDL: 179 mg/dL

## 2022-09-02 LAB — TSH: TSH: 1.42 u[IU]/mL (ref 0.35–5.50)

## 2022-09-02 LAB — MICROALBUMIN / CREATININE URINE RATIO
Creatinine,U: 136.4 mg/dL
Microalb Creat Ratio: 0.6 mg/g (ref 0.0–30.0)
Microalb, Ur: 0.8 mg/dL (ref 0.0–1.9)

## 2022-09-02 LAB — HEMOGLOBIN A1C: Hgb A1c MFr Bld: 5.6 % (ref 4.6–6.5)

## 2022-09-02 MED ORDER — ASPIRIN 81 MG PO TBEC: 81.0000 mg | DELAYED_RELEASE_TABLET | Freq: Every day | ORAL | 0 refills | Status: AC

## 2022-09-02 MED ORDER — ROSUVASTATIN CALCIUM 20 MG PO TABS: 20.0000 mg | ORAL_TABLET | Freq: Every day | ORAL | 3 refills | Status: AC

## 2022-09-02 MED ORDER — AMLODIPINE BESYLATE 10 MG PO TABS: 5.0000 mg | ORAL_TABLET | Freq: Every day | ORAL | 0 refills | Status: DC

## 2022-09-02 MED ORDER — LOSARTAN POTASSIUM-HCTZ 100-25 MG PO TABS: 1.0000 | ORAL_TABLET | Freq: Every day | ORAL | 0 refills | Status: DC

## 2022-09-02 MED ORDER — KETOROLAC TROMETHAMINE 60 MG/2ML IM SOLN
30.0000 mg | Freq: Once | INTRAMUSCULAR | Status: AC
  Administered 2022-09-02: 30 mg via INTRAMUSCULAR

## 2022-09-02 NOTE — Assessment & Plan Note (Signed)
Differential Diagnosis: Primary osteoarthritis vs Baker cyst. Plan to order orthopedic evaluation and consider knee aspiration to address Baker cyst and pain. Continue prednisone as prescribed.

## 2022-09-02 NOTE — Assessment & Plan Note (Signed)
D/C lisinopril/hctx Increase amlodipine to 10 mg Start losartan/hctz 100mg /25 mg Labs as ordered Plan to monitor blood pressure and reinforce lifestyle modifications for better blood pressure control.

## 2022-09-02 NOTE — Progress Notes (Signed)
Assessment/Plan:   Problem List Items Addressed This Visit       Cardiovascular and Mediastinum   Resistant hypertension - Primary    D/C lisinopril/hctx Increase amlodipine to 10 mg Start losartan/hctz 100mg /25 mg Labs as ordered Plan to monitor blood pressure and reinforce lifestyle modifications for better blood pressure control.      Relevant Medications   rosuvastatin (CRESTOR) 20 MG tablet   aspirin EC 81 MG tablet   amLODipine (NORVASC) 10 MG tablet   losartan-hydrochlorothiazide (HYZAAR) 100-25 MG tablet   Other Relevant Orders   TSH   Lipid panel   Hemoglobin A1c   Microalbumin / creatinine urine ratio   Urinalysis, Routine w reflex microscopic   CBC with Differential/Platelet   Comprehensive metabolic panel   Atherosclerosis of arteries    Seen on prior xray, high risk  Start rosuvastatin Add ASA 81 mg      Relevant Medications   rosuvastatin (CRESTOR) 20 MG tablet   aspirin EC 81 MG tablet   amLODipine (NORVASC) 10 MG tablet   losartan-hydrochlorothiazide (HYZAAR) 100-25 MG tablet     Respiratory   Chronic obstructive pulmonary disease with acute exacerbation (HCC)    Continue treatment with Prednisone, Doxycycline, and Albuterol. Reinforce proper inhaler technique and strongly advise smoking cessation. Consider getting PFTs after clearance of current exacerbatoin        Other   Left knee pain    Differential Diagnosis: Primary osteoarthritis vs Baker cyst. Plan to order orthopedic evaluation and consider knee aspiration to address Baker cyst and pain. Continue prednisone as prescribed.      Other Visit Diagnoses     Baker cyst, left       Relevant Medications   ketorolac (TORADOL) injection 30 mg (Completed)   Other Relevant Orders   Ambulatory referral to Sports Medicine   Tobacco abuse           Medications Discontinued During This Encounter  Medication Reason   lisinopril-hydrochlorothiazide (ZESTORETIC) 20-25 MG tablet     amLODipine (NORVASC) 5 MG tablet Reorder      Subjective:  HPI: Encounter date: 09/02/2022  Wayne Clarke is a 62 y.o. male who has Cough; Primary hypertension; Left knee pain; Resistant hypertension; Chronic obstructive pulmonary disease with acute exacerbation (HCC); and Atherosclerosis of arteries on their problem list..   He  has a past medical history of Hypertension..   CHIEF COMPLAINT: The patient presents with left knee pain and concerns regarding high blood pressure and respiratory symptoms.  HISTORY OF PRESENT ILLNESS: Patient went to ED on 08/30/2022 due to left knee pain, also found to have uncontrolled hypertension as well as COPD exacerbation.   - Problem 1: Chronic left knee pain with intermittent flare-ups and swelling, the recent episode lasting for the past 3 weeks. The emergency department visit on 08/30/2022 showed no DVT on ultrasound but suggested a Baker's cyst. Pain with extension and flexion. It is 7/10. Reports mild improvement s/p starting prednisone at ED.   - Problem 2: Elevated blood pressure noted during the same emergency department visit, measuring around 200/100 mmHg. There is a history of hypertension managed with Lisinopril/HCTZ and added Amlodipine 5mg  at ED;   - Problem 3: Respiratory symptoms including wheezing on exam were noted at the emergency department. No signs of pneumonia were seen on chest X-ray. History of cough and mucous build-up were reported, warranting a provisional diagnosis of possible COPD exacerbation. Patient smokes 1 pack of Patient prescribed prednisone 40 mg q 5  day, and doxycycline 100 mg bid for 7 days. Currently of day 3 of both. Reports improvement in breathing in cough since treatment.  REVIEW OF SYSTEMS: The patient reports night sweats, wheezing, and productive cough. Denies any other system symptoms at the moment.  History reviewed. No pertinent surgical history.  Outpatient Medications Prior to Visit   Medication Sig Dispense Refill   albuterol (VENTOLIN HFA) 108 (90 Base) MCG/ACT inhaler Inhale 2 puffs into the lungs every 4 (four) hours as needed for wheezing or shortness of breath. 1 each 0   doxycycline (VIBRAMYCIN) 100 MG capsule Take 1 capsule (100 mg total) by mouth 2 (two) times daily for 7 days. 14 capsule 0   predniSONE (DELTASONE) 20 MG tablet Take 2 tablets (40 mg total) by mouth daily with breakfast for 5 days. 10 tablet 0   amLODipine (NORVASC) 5 MG tablet Take 1 tablet (5 mg total) by mouth daily. 30 tablet 0   lisinopril-hydrochlorothiazide (ZESTORETIC) 20-25 MG tablet Take 1 tablet by mouth daily. 30 tablet 0   No facility-administered medications prior to visit.    History reviewed. No pertinent family history.  Social History   Socioeconomic History   Marital status: Single    Spouse name: Not on file   Number of children: Not on file   Years of education: Not on file   Highest education level: Not on file  Occupational History   Not on file  Tobacco Use   Smoking status: Some Days    Packs/day: 1.00    Years: 30.00    Total pack years: 30.00    Types: Cigarettes   Smokeless tobacco: Never  Vaping Use   Vaping Use: Never used  Substance and Sexual Activity   Alcohol use: Yes   Drug use: No   Sexual activity: Not on file  Other Topics Concern   Not on file  Social History Narrative   Not on file   Social Determinants of Health   Financial Resource Strain: Not on file  Food Insecurity: Not on file  Transportation Needs: Not on file  Physical Activity: Not on file  Stress: Not on file  Social Connections: Not on file  Intimate Partner Violence: Not on file                                                                                                 Objective:  Physical Exam: BP (!) 222/124   Pulse 76   Temp 98 F (36.7 C) (Oral)   Ht 5' 6.5" (1.689 m)   Wt 214 lb (97.1 kg)   SpO2 98%   BMI 34.02 kg/m    General: Alert and  conversant, no acute distress. Cardiovascular: Normal heart sounds, no murmurs heard. Respiratory: Clear lungs, no wheezes upon auscultation at this visit, breathing non-labored. Musculoskeletal: Left knee is swollen with tenderness in the anterior as well as posterior. Dermatological: Warm skin, no rashes. Neurological: Cranial nerves II-XII grossly intact, no deficits noted. Psychiatric: Alert, oriented, cooperative, normal mood, and judgment.  IMAGING: Left knee xray 01/2022, suggests mild tricompartmental degenerative changes and enthesopathic changes  in the left knee with osteopenia, and trace joint effusion with vascular calcifications. Recent ultrasound indicates possible Baker's cyst without signs of DVT.     Alesia Banda, MD, MS

## 2022-09-02 NOTE — Assessment & Plan Note (Signed)
Continue treatment with Prednisone, Doxycycline, and Albuterol. Reinforce proper inhaler technique and strongly advise smoking cessation. Consider getting PFTs after clearance of current exacerbatoin

## 2022-09-02 NOTE — Patient Instructions (Signed)
For right knee cyst, we are referring to sports medicine and gave Toradol injection. For COPD, continue prednisone and doxycycline.  Use albuterol as needed.  Work on quitting smoking as this is likely to make this worse. For high blood pressure, we are increasing amlodipine to 10 mg, stop taking lisinopril/hydrochlorothiazide, we are starting losartan/hydrochlorothiazide. For high risk of heart disease and history of calcifications in your arteries that were seen on the last knee x-ray, we are adding rosuvastatin.  Please go to the emergency room if you develop severe chest pain, shortness of breath, or any other worrisome symptom.

## 2022-09-02 NOTE — Assessment & Plan Note (Signed)
Seen on prior xray, high risk  Start rosuvastatin Add ASA 81 mg

## 2022-09-03 NOTE — Progress Notes (Unsigned)
I, Wayne Clarke, LAT, ATC acting as a scribe for Wayne Leader, MD.  Subjective:    CC: L knee pain  HPI: Pt is a 62 y/o male c/o L knee pain x 1 month. No MOI. Pt had a fall 10 years ago, landing on his L knee. Pt locates pain to all over the L knee and described pain an "ache."  Currently receiving doxycycline for COPD exacerbation.  L Knee swelling: yes Mechanical symptoms: no Aggravates: walking, lateral movements Treatments tried: prednisone,   Pertinent review of Systems: No fevers or chills  Relevant historical information: Hypertension   Objective:    Vitals:   09/06/22 1016  BP: (!) 192/98  Pulse: 78  SpO2: 95%   General: Well Developed, well nourished, and in no acute distress.   MSK: Left knee: Large joint effusion otherwise normal-appearing Decreased range of motion. Tender palpation medial joint line. Stable, exam intact strength.  Lower leg pitting edema left calf diameter greater than right.  Lab and Radiology Results  Procedure: Real-time Ultrasound Guided aspiration and injection of left knee joint superior lateral patellar space Device: Philips Affiniti 50G Images permanently stored and available for review in PACS Ultrasound evaluation prior to aspiration and injection reveals a large joint effusion and a large Baker's cyst. Verbal informed consent obtained.  Discussed risks and benefits of procedure. Warned about infection, bleeding, hyperglycemia damage to structures among others. Patient expresses understanding and agreement Time-out conducted.   Noted no overlying erythema, induration, or other signs of local infection.   Skin prepped in a sterile fashion.   Local anesthesia: Topical Ethyl chloride.   With sterile technique and under real time ultrasound guidance: 3 mL of lidocaine injected into subcutaneous tissue achieving good anesthesia. Skin again sterilized with isopropyl alcohol and a 18-gauge needle used to access the joint  effusion. 45 mL of slightly cloudy yellow fluid aspirated. Syringe was exchanged and 40 mg of Kenalog and 2 mg of Marcaine were injected into the knee joint. Knee seem to be decompressed following aspiration and injection.. Completed without difficulty   Pain immediately resolved suggesting accurate placement of the medication.   Advised to call if fevers/chills, erythema, induration, drainage, or persistent bleeding.   Images permanently stored and available for review in the ultrasound unit.  Impression: Technically successful ultrasound guided injection.   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  No acute fractures are visible. Await formal radiology review   Venous Doppler August 31, 2022 Summary:  RIGHT:  - No evidence of common femoral vein obstruction.    LEFT:  - There is no evidence of deep vein thrombosis in the lower extremity.    - A cystic structure is found in the popliteal fossa and extending into  the proximal calf measuring 7.3 x 1.6 cm. Some low level internal echoes.     *See table(s) above for measurements and observations.   Electronically signed by Deitra Mayo MD on 08/31/2022 at 4:22:18  PM.   Impression and Recommendations:    Assessment and Plan: 62 y.o. male with left knee pain and effusion occurring with a Baker's cyst.  Patient has a large joint effusion and a large Baker's cyst.  This is likely due to underlying knee DJD but coexisting other cause such as gout is certainly a possibility.  The large joint effusion and Baker's cyst is probably causing the leg swelling seen last week resulted in a vascular ultrasound that fortunately  did not show DVT.  The knee effusion seems to be bothering him more than the Baker's cyst is.  The knee effusion was aspirated and injected resulted in significant decrease in discomfort and pain.  He is not feeling a lot better in a  week he will return and I will aspirate and inject the Baker's cyst very likely.  I sent the aspirated knee effusion fluid to the lab to assess for cell count differential crystal and culture.  PDMP not reviewed this encounter. Orders Placed This Encounter  Procedures   Anaerobic and Aerobic Culture    Standing Status:   Future    Number of Occurrences:   1    Standing Expiration Date:   09/07/2023   [place] LIMITED JOINT SPACE STRUCTURES LOW LEFT(NO LINKED CHARGES)    Order Specific Question:   Reason for Exam (SYMPTOM  OR DIAGNOSIS REQUIRED)    Answer:   left knee pain    Order Specific Question:   Preferred imaging location?    Answer:   [facility]   DG Knee AP/LAT W/Sunrise Left    Standing Status:   Future    Number of Occurrences:   1    Standing Expiration Date:   10/07/2022    Order Specific Question:   Reason for Exam (SYMPTOM  OR DIAGNOSIS REQUIRED)    Answer:   left knee pain    Order Specific Question:   Preferred imaging location?    Answer:   [facility]   Synovial Fluid Analysis, Complete    Standing Status:   Future    Number of Occurrences:   1    Standing Expiration Date:   09/07/2023   No orders of the defined types were placed in this encounter.   Discussed warning signs or symptoms. Please see discharge instructions. Patient expresses understanding.   The above documentation has been reviewed and is accurate and complete Wayne Clarke, M.D.

## 2022-09-06 ENCOUNTER — Ambulatory Visit (INDEPENDENT_AMBULATORY_CARE_PROVIDER_SITE_OTHER): Payer: Managed Care, Other (non HMO)

## 2022-09-06 ENCOUNTER — Ambulatory Visit: Payer: Self-pay

## 2022-09-06 ENCOUNTER — Ambulatory Visit (INDEPENDENT_AMBULATORY_CARE_PROVIDER_SITE_OTHER): Payer: Managed Care, Other (non HMO) | Admitting: Family Medicine

## 2022-09-06 VITALS — BP 192/98 | HR 78 | Ht 66.5 in | Wt 211.0 lb

## 2022-09-06 DIAGNOSIS — G8929 Other chronic pain: Secondary | ICD-10-CM

## 2022-09-06 DIAGNOSIS — M25562 Pain in left knee: Secondary | ICD-10-CM

## 2022-09-06 DIAGNOSIS — M25462 Effusion, left knee: Secondary | ICD-10-CM | POA: Diagnosis not present

## 2022-09-06 DIAGNOSIS — M7122 Synovial cyst of popliteal space [Baker], left knee: Secondary | ICD-10-CM

## 2022-09-06 NOTE — Patient Instructions (Addendum)
Thank you for coming in today.   You received an injection today. Seek immediate medical attention if the joint becomes red, extremely painful, or is oozing fluid.   Please get an Xray today before you leave   If not good enough return in 1 week and I will drain that baker cyst in the back of the knee.   Let me know.

## 2022-09-07 NOTE — Progress Notes (Signed)
Left knee x-ray shows some arthritis changes.

## 2022-09-08 NOTE — Progress Notes (Signed)
Fluid from the knee did not show any crystals.  Infection is very unlikely.  So far the culture is not growing any bacteria.

## 2022-09-10 NOTE — Progress Notes (Unsigned)
   I, Peterson Lombard, LAT, ATC acting as a scribe for Lynne Leader, MD.  Wayne Clarke is a 62 y.o. male who presents to [place] at [facility] today for continued left knee pain.  Patient was last seen by Dr. Georgina Snell on 09/06/2022 and his left knee was aspirated and injected with a steroid, fluid was cultured.  Patient was advised if he was not feeling better to return in a week to aspirate the Baker's cyst.  Today, patient reports  Dx testing: 09/06/2022 L knee XR  08/31/2022 LE vascular ultrasound  Pertinent review of systems: ***  Relevant historical information: ***   Exam:  There were no vitals taken for this visit. General: Well Developed, well nourished, and in no acute distress.   MSK: ***    Lab and Radiology Results No results found for this or any previous visit (from the past 72 hour(s)). DG Knee AP/LAT W/Sun[place] Left  Result Date: 09/06/2022 CLINICAL DATA:  Pain EXAM: LEFT KNEE 3 VIEWS COMPARISON:  None Available. FINDINGS: Enthesopathic changes off the superior patella. Tricompartmental degenerative changes with partial loss of joint space medially. Suspected small joint effusion. No other bony or soft tissue abnormalities are noted. 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 Enthesopathic changes off the superior patella. Suspected small joint effusion. Electronically Signed   By: Dorise Bullion III M.D.   On: 09/06/2022 15:02       Assessment and Plan: 62 y.o. male with ***   PDMP not reviewed this encounter. No orders of the defined types were placed in this encounter.  No orders of the defined types were placed in this encounter.    Discussed warning signs or symptoms. Please see discharge instructions. Patient expresses understanding.   ***

## 2022-09-12 LAB — SYNOVIAL FLUID ANALYSIS, COMPLETE
Basophils, %: 0 %
Eosinophils-Synovial: 0 % (ref 0–2)
Lymphocytes-Synovial Fld: 40 % (ref 0–74)
Monocyte/Macrophage: 20 % (ref 0–69)
Neutrophil, Synovial: 40 % — ABNORMAL HIGH (ref 0–24)
Synoviocytes, %: 0 % (ref 0–15)
WBC, Synovial: 419 cells/uL — ABNORMAL HIGH (ref <150)

## 2022-09-12 LAB — ANAEROBIC AND AEROBIC CULTURE
AER RESULT:: NO GROWTH
GRAM STAIN:: NONE SEEN
MICRO NUMBER:: 14486081
MICRO NUMBER:: 14486082
SPECIMEN QUALITY:: ADEQUATE
SPECIMEN QUALITY:: ADEQUATE

## 2022-09-13 ENCOUNTER — Ambulatory Visit: Payer: Self-pay

## 2022-09-13 ENCOUNTER — Ambulatory Visit: Payer: Managed Care, Other (non HMO) | Admitting: Family Medicine

## 2022-09-13 VITALS — BP 222/122 | HR 74 | Ht 66.5 in | Wt 208.0 lb

## 2022-09-13 DIAGNOSIS — M25562 Pain in left knee: Secondary | ICD-10-CM

## 2022-09-13 DIAGNOSIS — G8929 Other chronic pain: Secondary | ICD-10-CM

## 2022-09-13 DIAGNOSIS — I10 Essential (primary) hypertension: Secondary | ICD-10-CM | POA: Diagnosis not present

## 2022-09-13 DIAGNOSIS — M7122 Synovial cyst of popliteal space [Baker], left knee: Secondary | ICD-10-CM

## 2022-09-13 NOTE — Progress Notes (Signed)
Reviewed at [facility] with Dr. Corey today.  

## 2022-09-13 NOTE — Patient Instructions (Signed)
Thank you for coming in today.   If this shot does not help much let me know and I willl get an MRI of your knee.   Please get you blood pressure medicine.

## 2022-09-13 NOTE — Progress Notes (Signed)
Reviewed at office visit with Dr. Georgina Snell today.

## 2022-09-13 NOTE — Progress Notes (Signed)
Knee culture is negative. No bacterial growth.

## 2022-09-13 NOTE — Progress Notes (Signed)
Reviewed at [place] with Dr. Georgina Snell today.

## 2022-09-15 ENCOUNTER — Telehealth: Payer: Self-pay | Admitting: Family Medicine

## 2022-09-15 NOTE — Telephone Encounter (Signed)
I tried to call to schedule follow appt for BP based on last appt with Dr Grandville Silos, Wanted scheduled around 09/16/22. lvm

## 2023-12-22 ENCOUNTER — Ambulatory Visit (INDEPENDENT_AMBULATORY_CARE_PROVIDER_SITE_OTHER)

## 2023-12-22 ENCOUNTER — Other Ambulatory Visit: Payer: Self-pay

## 2023-12-22 ENCOUNTER — Ambulatory Visit (HOSPITAL_COMMUNITY)
Admission: EM | Admit: 2023-12-22 | Discharge: 2023-12-22 | Disposition: A | Discharge status: Home / Self Care | Attending: Emergency Medicine | Admitting: Emergency Medicine

## 2023-12-22 ENCOUNTER — Encounter (HOSPITAL_COMMUNITY): Payer: Self-pay | Admitting: Emergency Medicine

## 2023-12-22 DIAGNOSIS — J069 Acute upper respiratory infection, unspecified: Secondary | ICD-10-CM

## 2023-12-22 DIAGNOSIS — R051 Acute cough: Secondary | ICD-10-CM

## 2023-12-22 DIAGNOSIS — I1A0 Resistant hypertension: Secondary | ICD-10-CM

## 2023-12-22 DIAGNOSIS — I10 Essential (primary) hypertension: Secondary | ICD-10-CM | POA: Diagnosis not present

## 2023-12-22 MED ORDER — BENZONATATE 100 MG PO CAPS: 100.0000 mg | ORAL_CAPSULE | Freq: Three times a day (TID) | ORAL | 0 refills | Status: AC

## 2023-12-22 MED ORDER — AZELASTINE HCL 0.1 % NA SOLN: 2.0000 | Freq: Two times a day (BID) | NASAL | 0 refills | Status: AC

## 2023-12-22 MED ORDER — LOSARTAN POTASSIUM-HCTZ 100-25 MG PO TABS: 1.0000 | ORAL_TABLET | Freq: Every day | ORAL | 0 refills | Status: AC

## 2023-12-22 MED ORDER — PREDNISONE 20 MG PO TABS: 40.0000 mg | ORAL_TABLET | Freq: Every day | ORAL | 0 refills | Status: AC

## 2023-12-22 MED ORDER — AMLODIPINE BESYLATE 10 MG PO TABS: 5.0000 mg | ORAL_TABLET | Freq: Every day | ORAL | 0 refills | Status: AC

## 2023-12-22 MED ORDER — PROMETHAZINE-DM 6.25-15 MG/5ML PO SYRP: 5.0000 mL | ORAL_SOLUTION | Freq: Every evening | ORAL | 0 refills | Status: AC | PRN

## 2023-12-22 NOTE — ED Triage Notes (Signed)
 Cough, congestion, and body aches that patient says started a month ago.  This is the first time seeing a provider for this issue.  Has been taking mucinex and tylenol 

## 2023-12-22 NOTE — Discharge Instructions (Addendum)
 Your chest x-ray is negative for pneumonia. Starting prednisone  2 tablets once daily for 5 days. Take Tessalon  every 8 hours as needed for cough. Take Promethazine DM at bedtime as needed for cough.  This can make you drowsy so do not drive, work, or drink alcohol while taking this. Use azelastine nasal spray twice daily for 7 days. You can continue to use Mucinex for cough and congestion.  Start taking half a tablet of amlodipine  and 1 tablet of losartan -hydrochlorothiazide  once daily to help manage her blood pressure.  Follow-up with primary care doctor that you are established with today for further management of your blood pressure. Return here as needed.

## 2023-12-22 NOTE — ED Provider Notes (Signed)
 [facility]    CSN: [id number] Arrival date & time: 12/22/23  7829      History   Chief Complaint Chief Complaint  Patient presents with   Cough    HPI Wayne Clarke is a 63 y.o. male.   Patient presents with cough, chest congestion, body aches, and sweats x 1 month.  Patient states last night he sweat so much he had to change his shirt multiple times.  Denies shortness of breath, chest pain, vomiting, diarrhea, abdominal pain, headache, and sore throat.  Patient has history of COPD but denies using any daily inhalers.  Patient was previously prescribed an albuterol  inhaler, patient denies needing to use that recently.  Patient also has a history of hypertension.  Patient was started on 2 blood pressure medications by PCP in January 2024.  Upon asking patient if he is taking his blood pressure medication he stated that he did not know he was supposed to be on a blood pressure medication and therefore has not ever taken any.  The history is provided by the patient and medical records.  Cough   Past Medical History:  Diagnosis Date   Hypertension     Patient Active Problem List   Diagnosis Date Noted   Resistant hypertension 09/02/2022   Chronic obstructive pulmonary disease with acute exacerbation (HCC) 09/02/2022   Atherosclerosis of arteries 09/02/2022   Cough 02/01/2022   Primary hypertension 02/01/2022   Left knee pain 02/01/2022    Past Surgical History:  Procedure Laterality Date   EYE SURGERY         Home Medications    Prior to Admission medications   Medication Sig Start Date End Date Taking? Authorizing Provider  azelastine (ASTELIN) 0.1 % nasal spray Place 2 sprays into both nostrils 2 (two) times daily. Use in each nostril as directed 12/22/23  Yes Levora Reas A, NP  benzonatate  (TESSALON ) 100 MG capsule Take 1 capsule (100 mg total) by mouth every 8 (eight) hours. 12/22/23  Yes Rosevelt Constable, Javin Nong A, NP  predniSONE  (DELTASONE ) 20  MG tablet Take 2 tablets (40 mg total) by mouth daily for 5 days. 12/22/23 12/27/23 Yes Liliane Mallis A, NP  promethazine-dextromethorphan (PROMETHAZINE-DM) 6.25-15 MG/5ML syrup Take 5 mLs by mouth at bedtime as needed for cough. 12/22/23  Yes Levora Reas A, NP  albuterol  (VENTOLIN  HFA) 108 (90 Base) MCG/ACT inhaler Inhale 2 puffs into the lungs every 4 (four) hours as needed for wheezing or shortness of breath. 08/30/22   Ann Keto, MD  amLODipine  (NORVASC ) 10 MG tablet Take 0.5 tablets (5 mg total) by mouth daily. 12/22/23 03/21/24  Levora Reas A, NP  losartan -hydrochlorothiazide  (HYZAAR) 100-25 MG tablet Take 1 tablet by mouth daily. 12/22/23 03/21/24  Levora Reas A, NP  rosuvastatin  (CRESTOR ) 20 MG tablet Take 1 tablet (20 mg total) by mouth daily. 09/02/22   Catheryn Cluck, MD    Family History History reviewed. No pertinent family history.  Social History Social History   Tobacco Use   Smoking status: Some Days    Current packs/day: 1.00    Average packs/day: 1 pack/day for 30.0 years (30.0 ttl pk-yrs)    Types: Cigarettes   Smokeless tobacco: Never  Vaping Use   Vaping status: Never Used  Substance Use Topics   Alcohol use: Yes   Drug use: No     Allergies   Patient has no known allergies.   Review of Systems Review of Systems  Respiratory:  Positive for cough.  Per HPI  Physical Exam Triage Vital Signs ED Triage Vitals  Encounter Vitals Group     BP 12/22/23 0928 (!) 196/122     Systolic BP Percentile --      Diastolic BP Percentile --      Pulse Rate 12/22/23 0928 82     Resp 12/22/23 0928 (!) 24     Temp 12/22/23 0928 98.1 F (36.7 C)     Temp Source 12/22/23 0928 Oral     SpO2 12/22/23 0928 96 %     Weight --      Height --      Head Circumference --      Peak Flow --      Pain Score 12/22/23 0925 4     Pain Loc --      Pain Education --      Exclude from Growth Chart --    No data found.  Updated Vital Signs BP (!)  172/91 (BP Location: Left Arm) Comment (BP Location): large cuff, different equipment and cuff  Pulse 82   Temp 98.1 F (36.7 C) (Oral)   Resp (!) 24   SpO2 96%   Visual Acuity Right Eye Distance:   Left Eye Distance:   Bilateral Distance:    Right Eye Near:   Left Eye Near:    Bilateral Near:     Physical Exam Vitals and nursing note reviewed.  Constitutional:      General: He is awake. He is not in acute distress.    Appearance: Normal appearance. He is well-developed and well-groomed. He is not ill-appearing.  HENT:     Right Ear: Tympanic membrane, ear canal and external ear normal.     Left Ear: Tympanic membrane, ear canal and external ear normal.     Nose: Congestion and rhinorrhea present.     Mouth/Throat:     Mouth: Mucous membranes are moist.     Pharynx: Posterior oropharyngeal erythema and postnasal drip present. No oropharyngeal exudate.  Cardiovascular:     Rate and Rhythm: Normal rate and regular rhythm.  Pulmonary:     Effort: Tachypnea present.     Breath sounds: Normal breath sounds.  Skin:    General: Skin is warm and dry.  Neurological:     Mental Status: He is alert.  Psychiatric:        Behavior: Behavior is cooperative.      UC Treatments / Results  Labs (all labs ordered are listed, but only abnormal results are displayed) Labs Reviewed - No data to display  EKG   Radiology DG Chest 2 View Result Date: 12/22/2023 CLINICAL DATA:  Cough EXAM: CHEST - 2 VIEW COMPARISON:  August 30, 2022 FINDINGS: The heart size and mediastinal contours are within normal limits. Both lungs are clear. The visualized skeletal structures are unremarkable. IMPRESSION: No active cardiopulmonary disease. Electronically Signed   By: Fredrich Jefferson M.D.   On: 12/22/2023 10:06    Procedures Procedures (including critical care time)  Medications Ordered in UC Medications - No data to display  Initial Impression / Assessment and Plan / UC Course  I have  reviewed the triage vital signs and the nursing notes.  Pertinent labs & imaging results that were available during my care of the patient were reviewed by me and considered in my medical decision making (see chart for details).     Patient is well-appearing.  Vitals are stable.  Blood pressure elevated at 172/91.  Patient is also mildly  tachypneic at 24 respirations per minute.  Otherwise respiratory effort is normal without any accessory muscle use.  Breath sounds are also normal.  Congestion and rhinorrhea are present, mild erythema and PND noted to pharynx.  Ordered chest x-ray.  Based on my interpretation there is no pneumonia or active cardiopulmonary disease.  Radiology report confirms this.  Prescribed prednisone  burst for upper respiratory infection due to history of COPD and smoker.  Prescribed Tessalon  and Promethazine DM as needed for cough.  Prescribed azelastine nasal spray for congestion.  Refilled amlodipine  and losartan -hydrochlorothiazide .  Recommended patient start these blood pressure medications and follow-up with primary care provider for further evaluation.  Had clinical staff help set up patient with primary care appointment.  Discussed follow-up and return precautions. Final Clinical Impressions(s) / UC Diagnoses   Final diagnoses:  Acute cough  Essential hypertension  Upper respiratory tract infection, unspecified type     Discharge Instructions      Your chest x-ray is negative for pneumonia. Starting prednisone  2 tablets once daily for 5 days. Take Tessalon  every 8 hours as needed for cough. Take Promethazine DM at bedtime as needed for cough.  This can make you drowsy so do not drive, work, or drink alcohol while taking this. Use azelastine nasal spray twice daily for 7 days. You can continue to use Mucinex for cough and congestion.  Start taking half a tablet of amlodipine  and 1 tablet of losartan -hydrochlorothiazide  once daily to help manage her blood  pressure.  Follow-up with primary care doctor that you are established with today for further management of your blood pressure. Return here as needed.   ED Prescriptions     Medication Sig Dispense Auth. Provider   amLODipine  (NORVASC ) 10 MG tablet Take 0.5 tablets (5 mg total) by mouth daily. 45 tablet Rosevelt Constable, Cidney Kirkwood A, NP   losartan -hydrochlorothiazide  (HYZAAR) 100-25 MG tablet Take 1 tablet by mouth daily. 90 tablet Rosevelt Constable, Roger Kettles A, NP   benzonatate  (TESSALON ) 100 MG capsule Take 1 capsule (100 mg total) by mouth every 8 (eight) hours. 21 capsule Levora Reas A, NP   promethazine-dextromethorphan (PROMETHAZINE-DM) 6.25-15 MG/5ML syrup Take 5 mLs by mouth at bedtime as needed for cough. 118 mL Rosevelt Constable, Lotoya Casella A, NP   azelastine (ASTELIN) 0.1 % nasal spray Place 2 sprays into both nostrils 2 (two) times daily. Use in each nostril as directed 30 mL Levora Reas A, NP   predniSONE  (DELTASONE ) 20 MG tablet Take 2 tablets (40 mg total) by mouth daily for 5 days. 10 tablet Levora Reas A, NP      PDMP not reviewed this encounter.   Levora Reas A, NP 12/22/23 1025

## 2023-12-22 NOTE — ED Notes (Signed)
 Provided multiple work notes and appt paper

## 2024-02-16 ENCOUNTER — Ambulatory Visit: Admitting: Family Medicine

## 2024-02-16 NOTE — Progress Notes (Deleted)
   Acute Office Visit  Subjective:     Patient ID: Wayne Clarke, male    DOB: 16-Jul-1961, 63 y.o.   MRN: [id number]  No chief complaint on file.   HPI  Discussed the use of AI scribe software for clinical note transcription with the patient, who gave verbal consent to proceed.  History of Present Illness      ROS Per HPI      Objective:    There were no vitals taken for this visit.   Physical Exam Vitals and nursing note reviewed.  Constitutional:      General: He is not in acute distress.    Appearance: Normal appearance.  HENT:     Head: Normocephalic and atraumatic.     Right Ear: External ear normal.     Left Ear: External ear normal.     Nose: Nose normal.     Mouth/Throat:     Mouth: Mucous membranes are moist.     Pharynx: Oropharynx is clear.  Eyes:     Extraocular Movements: Extraocular movements intact.  Cardiovascular:     Rate and Rhythm: Normal rate and regular rhythm.     Pulses: Normal pulses.     Heart sounds: Normal heart sounds.  Pulmonary:     Effort: Pulmonary effort is normal. No respiratory distress.     Breath sounds: Normal breath sounds. No wheezing, rhonchi or rales.  Musculoskeletal:        General: Normal range of motion.     Cervical back: Normal range of motion.     Right lower leg: No edema.     Left lower leg: No edema.  Lymphadenopathy:     Cervical: No cervical adenopathy.  Skin:    General: Skin is warm and dry.  Neurological:     General: No focal deficit present.     Mental Status: He is alert and oriented to person, place, and time.  Psychiatric:        Mood and Affect: Mood normal.        Behavior: Behavior normal.    No results found for any visits on 02/16/24.      Assessment & Plan:   Assessment and Plan Assessment & Plan       No orders of the defined types were placed in this encounter.   No follow-ups on file.  Corean LITTIE Ku, FNP

## 2024-02-16 NOTE — Patient Instructions (Incomplete)
